# Patient Record
Sex: Male | Born: 1990 | Race: White | Hispanic: No | Marital: Single | Smoking: Former smoker
Health system: Southern US, Community
[De-identification: ages and names within clinical notes are randomized; demographics above are authoritative.]

## PROBLEM LIST (undated history)

## (undated) DIAGNOSIS — G521 Disorders of glossopharyngeal nerve: Secondary | ICD-10-CM

## (undated) HISTORY — PX: RADIOFREQUENCY ABLATION: SHX5911

---
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
## 2018-05-25 ENCOUNTER — Ambulatory Visit (INDEPENDENT_AMBULATORY_CARE_PROVIDER_SITE_OTHER)
Admission: RE | Admit: 2018-05-25 | Discharge: 2018-05-25 | Disposition: A | Discharge status: Home / Self Care | Payer: Commercial Managed Care - PPO | Source: Ambulatory Visit | Attending: Family Medicine | Admitting: Family Medicine

## 2018-05-25 ENCOUNTER — Encounter: Payer: Self-pay | Admitting: Family Medicine

## 2018-05-25 ENCOUNTER — Ambulatory Visit (INDEPENDENT_AMBULATORY_CARE_PROVIDER_SITE_OTHER): Payer: Commercial Managed Care - PPO | Admitting: Family Medicine

## 2018-05-25 VITALS — BP 122/80 | HR 63 | Ht 69.0 in | Wt 143.0 lb

## 2018-05-25 DIAGNOSIS — R293 Abnormal posture: Secondary | ICD-10-CM | POA: Diagnosis not present

## 2018-05-25 DIAGNOSIS — G2589 Other specified extrapyramidal and movement disorders: Secondary | ICD-10-CM | POA: Diagnosis not present

## 2018-05-25 DIAGNOSIS — M542 Cervicalgia: Secondary | ICD-10-CM | POA: Insufficient documentation

## 2018-05-25 MED ORDER — HYDROXYZINE HCL 10 MG PO TABS: 10.0000 mg | ORAL_TABLET | Freq: Three times a day (TID) | ORAL | 0 refills | Status: DC | PRN

## 2018-05-25 MED ORDER — TIZANIDINE HCL 4 MG PO TABS: 4.0000 mg | ORAL_TABLET | Freq: Every evening | ORAL | 2 refills | Status: DC

## 2018-05-25 NOTE — Patient Instructions (Signed)
Good to see you  Xray downstairs Ice is your friend. Ice 20 minutes 2 times daily. Usually after activity and before bed. Posture is key  On wall with heels, butt shoulder and head touching for a goal of 5 minutes daily  Exercises 3 times a week.  Muscle relaxer at night Hydroxyzine 3 times a day as needed See me again in 4 weeks

## 2018-05-25 NOTE — Assessment & Plan Note (Signed)
Poor posture, scapular dyskinesis, as well as stress likely contributing to patient's aches and pains.  Patient is going to do hydroxyzine for any type of stress, muscle relaxer at night, patient is going to get x-rays of the neck.  Discussed posture and ergonomics throughout the day.  Home exercise given and work with Event organiserathletic trainer.  Follow-up with me again in 3 to 4 weeks

## 2018-06-16 ENCOUNTER — Other Ambulatory Visit: Payer: Self-pay | Admitting: Emergency Medicine

## 2018-06-16 MED ORDER — HYDROXYZINE HCL 10 MG PO TABS: 10.0000 mg | ORAL_TABLET | Freq: Three times a day (TID) | ORAL | 0 refills | Status: DC | PRN

## 2018-06-16 NOTE — Telephone Encounter (Signed)
Pt called in requesting X-ray results. Advised pt Dr Katrinka Blazing is out of the office but will have someone call on Monday verifying the results are normal. Pt also requesting a refill of hydroxyzine

## 2018-07-11 DIAGNOSIS — H9203 Otalgia, bilateral: Secondary | ICD-10-CM | POA: Insufficient documentation

## 2018-08-09 ENCOUNTER — Telehealth: Payer: Self-pay | Admitting: Family Medicine

## 2018-08-09 NOTE — Telephone Encounter (Signed)
Does patient need appt for refill?

## 2018-08-09 NOTE — Telephone Encounter (Signed)
Copied from CRM [id number]. Topic: Quick Communication - Rx Refill/Question >> Aug 09, 2018  4:10 PM Elliot Gault wrote: Medication:  hydrOXYzine (ATARAX/VISTARIL) 10 MG tablet and tiZANidine (ZANAFLEX) 4 MG tablet   Has the patient contacted their pharmacy? No , informed patient please call pharmacy for any future request.  (Agent: If no, request that the patient contact the pharmacy for the refill.)   Preferred Pharmacy (with phone number or street name): [id number] - [place], [place] - [address] [phone] (Phone) [phone] (Fax)    Agent: Please be advised that RX refills may take up to 3 business days. We ask that you follow-up with your pharmacy.

## 2018-08-11 ENCOUNTER — Other Ambulatory Visit: Payer: Self-pay | Admitting: Family Medicine

## 2019-01-05 ENCOUNTER — Other Ambulatory Visit: Payer: Self-pay | Admitting: Family Medicine

## 2020-03-04 ENCOUNTER — Other Ambulatory Visit: Payer: Self-pay | Admitting: Neurology

## 2020-03-04 DIAGNOSIS — M5412 Radiculopathy, cervical region: Secondary | ICD-10-CM

## 2020-03-19 ENCOUNTER — Other Ambulatory Visit: Payer: Self-pay

## 2020-03-19 ENCOUNTER — Ambulatory Visit
Admission: RE | Admit: 2020-03-19 | Discharge: 2020-03-19 | Disposition: A | Discharge status: Home / Self Care | Payer: Commercial Managed Care - PPO | Source: Ambulatory Visit | Attending: Neurology | Admitting: Neurology

## 2020-03-19 DIAGNOSIS — M5412 Radiculopathy, cervical region: Secondary | ICD-10-CM | POA: Insufficient documentation

## 2020-04-11 ENCOUNTER — Other Ambulatory Visit: Payer: Self-pay | Admitting: Neurology

## 2020-04-11 DIAGNOSIS — M792 Neuralgia and neuritis, unspecified: Secondary | ICD-10-CM

## 2020-04-28 ENCOUNTER — Other Ambulatory Visit: Payer: Self-pay

## 2020-04-28 ENCOUNTER — Ambulatory Visit
Admission: RE | Admit: 2020-04-28 | Discharge: 2020-04-28 | Disposition: A | Discharge status: Home / Self Care | Payer: Commercial Managed Care - PPO | Source: Ambulatory Visit | Attending: Neurology | Admitting: Neurology

## 2020-04-28 DIAGNOSIS — M792 Neuralgia and neuritis, unspecified: Secondary | ICD-10-CM | POA: Insufficient documentation

## 2020-06-19 ENCOUNTER — Ambulatory Visit: Payer: Commercial Managed Care - PPO | Attending: Neurology | Admitting: Physical Therapy

## 2020-06-19 ENCOUNTER — Encounter: Payer: Self-pay | Admitting: Physical Therapy

## 2020-06-19 ENCOUNTER — Other Ambulatory Visit: Payer: Self-pay

## 2020-06-19 DIAGNOSIS — M542 Cervicalgia: Secondary | ICD-10-CM

## 2020-06-19 DIAGNOSIS — R293 Abnormal posture: Secondary | ICD-10-CM | POA: Diagnosis present
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
## 2020-07-07 ENCOUNTER — Ambulatory Visit: Payer: Commercial Managed Care - PPO | Admitting: Physical Therapy

## 2020-07-17 ENCOUNTER — Ambulatory Visit: Payer: Commercial Managed Care - PPO

## 2020-07-25 ENCOUNTER — Other Ambulatory Visit: Payer: Self-pay

## 2020-07-25 ENCOUNTER — Ambulatory Visit: Payer: Commercial Managed Care - PPO | Attending: Neurology

## 2020-07-25 DIAGNOSIS — R293 Abnormal posture: Secondary | ICD-10-CM | POA: Diagnosis present

## 2020-07-25 DIAGNOSIS — M542 Cervicalgia: Secondary | ICD-10-CM

## 2020-07-25 NOTE — Therapy (Signed)
[facility] [address] [place], [place], [address] Phone: [phone]   Fax:  [phone]  Physical Therapy Treatment  Patient Details  Name: Jeffery Adkins MRN: [id number] Date of Birth: 15-May-1991 Referring Provider (PT): Morene Crocker, MD   Encounter Date: 07/25/2020   PT End of Session - 07/25/20 1559    Visit Number 2    Number of Visits 7    Date for PT Re-Evaluation 09/17/20    Authorization Type UMR 2021-$50 copay; 60 VL    PT Start Time 1530    PT Stop Time 1615    PT Time Calculation (min) 45 min    Activity Tolerance Patient tolerated treatment well    Behavior During Therapy [facility] for tasks assessed/performed           No past medical history on file.  No past surgical history on file.  There were no vitals filed for this visit.   Subjective Assessment - 07/25/20 1544    Subjective Pt reports currently he still has constant neck pain and ear pain on R. pain is 7/10    Limitations Walking    Patient Stated Goals To relieve pressure on nerves in the back of the head and neck.  Cervical traction - has a home traction unit.    Pain Score 7     Pain Location Neck    Pain Orientation Right;Left    Pain Descriptors / Indicators Aching;Throbbing;Pressure    Pain Onset More than a month ago    Pain Frequency Constant              Grade III-IV lateral glides at C1-4 bil Suboccipital release STM to R Sternocleidomastroid, suboccipital region Gentle cervical traction  Supine chin tucks: 20 x 5" holds only 10% effort to reduce compensation from SCM Seated cervical chin tucks: 20x 5" holds                             PT Long Term Goals - 06/19/20 1651      PT LONG TERM GOAL #1   Title Pt will demonstrate independence with cervical HEP    Time 12    Period Weeks    Status New    Target Date 09/17/20      PT LONG TERM GOAL #2   Title Pt will demonstrate 5 point  improvement in NDI    Baseline TBD    Time 12    Period Weeks    Status New    Target Date 09/17/20      PT LONG TERM GOAL #3   Title Pt will demonstrate 10 deg improvement in pain free cervical spine ROM for lateral flexion and rotation to L and R to improve ability to perform head turns with driving and at work    Baseline see flow sheets    Time 12    Period Weeks    Status New    Target Date 09/17/20      PT LONG TERM GOAL #4   Title Pt will report ability to perform weight lifting exercises 2x/week and ability to ride bike for 10-15 minutes at a time with <1-2/10 pain in neck/ear    Time 12    Period Weeks    Status New    Target Date 09/17/20                 Plan - 07/25/20 1547  Clinical Impression Statement Pt reported provocation of R ear pain withcontralateral lateral glides of C1-2. Pt also reported relief of R ear pain with ispilateral lateral glides. Pt has significant limitations with face opening and closing on R upper c-spine with muscle tightness. R SCM is also very tight.    Personal Factors and Comorbidities Past/Current Experience;Profession;Time since onset of injury/illness/exacerbation    Examination-Activity Limitations Locomotion Level;Reach Overhead    Examination-Participation Restrictions Community Activity;Driving;Occupation    Stability/Clinical Decision Making Stable/Uncomplicated    Rehab Potential Good    PT Frequency Biweekly   1 visit every other week   PT Duration 12 weeks    PT Treatment/Interventions ADLs/Self Care Home Management;Aquatic Therapy;Cryotherapy;Electrical Stimulation;Moist Heat;Traction;Ultrasound;Therapeutic activities;Therapeutic exercise;Balance training;Neuromuscular re-education;Patient/family education;Manual techniques;Passive range of motion;Dry needling;Taping;Spinal Manipulations    PT Next Visit Plan fill out NDI.  dry needling to suboccipitals/R levator/paraspinals/rhomboids.  Manual therapy to neck.  Spinal  manipulations?  Initiate HEP    PT Home Exercise Plan Access Code: [email or web address]    Consulted and Agree with Plan of Care Patient           Patient will benefit from skilled therapeutic intervention in order to improve the following deficits and impairments:  Decreased mobility, Decreased range of motion, Hypomobility, Impaired sensation, Postural dysfunction, Pain  Visit Diagnosis: Cervicalgia  Abnormal posture     Problem List Patient Active Problem List   Diagnosis Date Noted  . Poor posture 05/25/2018  . 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 Neck pain 05/25/2018    Ileana Ladd, PT 07/25/2020, 4:28 PM  [facility] [address] [place], [place], [address] Phone: [phone]   Fax:  [phone]  Name: Oakes Mccready MRN: [id number] Date of Birth: [birth year]

## 2020-07-25 NOTE — Patient Instructions (Signed)
Access Code: [email or web address] URL: [email or web address] Date: 07/25/2020 Prepared by: Lavone Nian  Exercises Supine Chin Tuck - 5 x daily - 7 x weekly - 1 sets - 10 reps - 5 hold Seated Cervical Retraction - 5 x daily - 7 x weekly - 1 sets - 10 reps - 5 hold

## 2020-08-04 ENCOUNTER — Ambulatory Visit: Payer: Commercial Managed Care - PPO | Admitting: Physical Therapy

## 2020-08-04 ENCOUNTER — Encounter: Payer: Self-pay | Admitting: Physical Therapy

## 2020-08-04 ENCOUNTER — Other Ambulatory Visit: Payer: Self-pay

## 2020-08-04 DIAGNOSIS — M542 Cervicalgia: Secondary | ICD-10-CM | POA: Diagnosis not present

## 2020-08-04 DIAGNOSIS — R293 Abnormal posture: Secondary | ICD-10-CM

## 2020-08-04 NOTE — Patient Instructions (Addendum)
Trigger Point Dry Needling  . What is Trigger Point Dry Needling (DN)? o DN is a physical therapy technique used to treat muscle pain and dysfunction. Specifically, DN helps deactivate muscle trigger points (muscle knots).  o A thin filiform needle is used to penetrate the skin and stimulate the underlying trigger point. The goal is for a local twitch response (LTR) to occur and for the trigger point to relax. No medication of any kind is injected during the procedure.   . What Does Trigger Point Dry Needling Feel Like?  o The procedure feels different for each individual patient. Some patients report that they do not actually feel the needle enter the skin and overall the process is not painful. Very mild bleeding may occur. However, many patients feel a deep cramping in the muscle in which the needle was inserted. This is the local twitch response.   Marland Kitchen How Will I feel after the treatment? o Soreness is normal, and the onset of soreness may not occur for a few hours. Typically this soreness does not last longer than two days.  o Bruising is uncommon, however; ice can be used to decrease any possible bruising.  o In rare cases feeling tired or nauseous after the treatment is normal. In addition, your symptoms may get worse before they get better, this period will typically not last longer than 24 hours.   . What Can I do After My Treatment? o Increase your hydration by drinking more water for the next 24 hours. o You may place ice or heat on the areas treated that have become sore, however, do not use heat on inflamed or bruised areas. Heat often brings more relief post needling. o You can continue your regular activities, but vigorous activity is not recommended initially after the treatment for 24 hours. o DN is best combined with other physical therapy such as strengthening, stretching, and other therapies   Access Code: [email or web address] URL: [email or web address] Date:  08/04/2020 Prepared by: Bufford Lope  Exercises Supine Chin Tuck - 5 x daily - 7 x weekly - 1 sets - 10 reps - 5 hold Seated Cervical Retraction - 5 x daily - 7 x weekly - 1 sets - 10 reps - 5 hold Quadruped Full Range Thoracic Rotation with Reach - 1 x daily - 7 x weekly - 2 sets - 5-6 reps

## 2020-08-04 NOTE — Therapy (Signed)
[facility] [address] [place], [place], [address] Phone: [phone]   Fax:  [phone]  Physical Therapy Treatment  Patient Details  Name: Jeffery Adkins MRN: [id number] Date of Birth: Jul 22, 1991 Referring Provider (PT): Morene Crocker, MD   Encounter Date: 08/04/2020   PT End of Session - 08/04/20 1647    Visit Number 3    Number of Visits 7    Date for PT Re-Evaluation 09/17/20    Authorization Type UMR 2021-$50 copay; 60 VL    PT Start Time 1537    PT Stop Time 1628    PT Time Calculation (min) 51 min    Activity Tolerance Patient tolerated treatment well    Behavior During Therapy [facility] for tasks assessed/performed           History reviewed. No pertinent past medical history.  History reviewed. No pertinent surgical history.  There were no vitals filed for this visit.   Subjective Assessment - 08/04/20 1538    Subjective Pt reports significant improvement/benefit from manual therapy.  Would like to do more of that.  Felt the muscles tighten up after that appointment.  Did the exercises and stretches at home and they seemed to help initially and now it hurts to perform them.    Limitations Walking    Patient Stated Goals To relieve pressure on nerves in the back of the head and neck.  Cervical traction - has a home traction unit.    Currently in Pain? Yes    Pain Score 4     Pain Location Neck    Pain Orientation Right    Pain Descriptors / Indicators Aching;Tightness    Pain Type Chronic pain    Pain Onset More than a month ago                             [facility] Adult PT Treatment/Exercise - 08/04/20 1643      Therapeutic Activites    Therapeutic Activities Other Therapeutic Activities    Other Therapeutic Activities Reviewed educational handout on dry needling, reviewed precautions and contraindications with none identified.  Reviewed common side effects and ways to mitigate  side effects      Exercises   Exercises Other Exercises    Other Exercises  Quadruped "thread the needle" x 3 reps to R and L side to increase thoracic rotation ROM, scapular mobility and contralateral shoulder stability      Manual Therapy   Manual Therapy Joint mobilization    Joint Mobilization in [place] performed grade II/III PA mobs to upper and mid thoracic spine with increased hypomobility noted on R side in upper thoracic spine and then on L side in mid thoracic spine.            Trigger Point Dry Needling - 08/04/20 1545    Consent Given? Yes    Education Handout Provided Yes    Muscles Treated Head and Neck Suboccipitals;Splenius capitus;Semispinalis capitus;Cervical multifidi    Muscles Treated Upper Quadrant Rhomboids    Dry Needling Comments Performed in prone; performed on R side only today due to patient's main area of concern    Suboccipitals Response Twitch response elicited;Palpable increased muscle length    Splenius capitus Response Twitch reponse elicited;Palpable increased muscle length    Semispinalis capitus Response Twitch reponse elicited;Palpable increased muscle length    Cervical multifidi Response Twitch reponse elicited;Palpable increased muscle length  Rhomboids Response Twitch response elicited;Palpable increased muscle length                PT Education - 08/04/20 1646    Education Details see TA, TDN education, updated HEP and added exercise for thoracic mobility.  Added one more visit due to pt having to cancel one visit    Person(s) Educated Patient    Methods Explanation;Demonstration;Handout    Comprehension Verbalized understanding;Returned demonstration            Trigger Point Dry Needling  . What is Trigger Point Dry Needling (DN)? o DN is a physical therapy technique used to treat muscle pain and dysfunction. Specifically, DN helps deactivate muscle trigger points (muscle knots).  o A thin filiform needle is used to  penetrate the skin and stimulate the underlying trigger point. The goal is for a local twitch response (LTR) to occur and for the trigger point to relax. No medication of any kind is injected during the procedure.   . What Does Trigger Point Dry Needling Feel Like?  o The procedure feels different for each individual patient. Some patients report that they do not actually feel the needle enter the skin and overall the process is not painful. Very mild bleeding may occur. However, many patients feel a deep cramping in the muscle in which the needle was inserted. This is the local twitch response.   Marland Kitchen How Will I feel after the treatment? o Soreness is normal, and the onset of soreness may not occur for a few hours. Typically this soreness does not last longer than two days.  o Bruising is uncommon, however; ice can be used to decrease any possible bruising.  o In rare cases feeling tired or nauseous after the treatment is normal. In addition, your symptoms may get worse before they get better, this period will typically not last longer than 24 hours.   . What Can I do After My Treatment? o Increase your hydration by drinking more water for the next 24 hours. o You may place ice or heat on the areas treated that have become sore, however, do not use heat on inflamed or bruised areas. Heat often brings more relief post needling. o You can continue your regular activities, but vigorous activity is not recommended initially after the treatment for 24 hours. o DN is best combined with other physical therapy such as strengthening, stretching, and other therapies   Access Code: [email or web address] URL: [email or web address] Date: 08/04/2020 Prepared by: Bufford Lope  Exercises Supine Chin Tuck - 5 x daily - 7 x weekly - 1 sets - 10 reps - 5 hold Seated Cervical Retraction - 5 x daily - 7 x weekly - 1 sets - 10 reps - 5 hold Quadruped Full Range Thoracic Rotation with Reach - 1 x daily - 7 x weekly  - 2 sets - 5-6 reps      PT Long Term Goals - 06/19/20 1651      PT LONG TERM GOAL #1   Title Pt will demonstrate independence with cervical HEP    Time 12    Period Weeks    Status New    Target Date 09/17/20      PT LONG TERM GOAL #2   Title Pt will demonstrate 5 point improvement in NDI    Baseline TBD    Time 12    Period Weeks    Status New    Target Date 09/17/20      PT LONG  TERM GOAL #3   Title Pt will demonstrate 10 deg improvement in pain free cervical spine ROM for lateral flexion and rotation to L and R to improve ability to perform head turns with driving and at work    Baseline see flow sheets    Time 12    Period Weeks    Status New    Target Date 09/17/20      PT LONG TERM GOAL #4   Title Pt will report ability to perform weight lifting exercises 2x/week and ability to ride bike for 10-15 minutes at a time with <1-2/10 pain in neck/ear    Time 12    Period Weeks    Status New    Target Date 09/17/20                 Plan - 08/04/20 1648    Clinical Impression Statement Pt reported significant benefit from previous manual therapy but little carryover to today's session.  Continued to address areas of increased myofascial pain, tension and decreased ROM with trigger point dry needling, manual therapy and thoracic mobility exercise.  Pt reporting decreased tension at end of session.  Will continue to address and progress towards LTG.    Personal Factors and Comorbidities Past/Current Experience;Profession;Time since onset of injury/illness/exacerbation    Examination-Activity Limitations Locomotion Level;Reach Overhead    Examination-Participation Restrictions Community Activity;Driving;Occupation    Stability/Clinical Decision Making Stable/Uncomplicated    Rehab Potential Good    PT Frequency Biweekly   1 visit every other week   PT Duration 12 weeks    PT Treatment/Interventions ADLs/Self Care Home Management;Aquatic Therapy;Cryotherapy;Electrical  Stimulation;Moist Heat;Traction;Ultrasound;Therapeutic activities;Therapeutic exercise;Balance training;Neuromuscular re-education;Patient/family education;Manual techniques;Passive range of motion;Dry needling;Taping;Spinal Manipulations    PT Next Visit Plan How did he respond to dry needling?  Manual therapy to thoracic spine and C1/C2.  Soft tissue work.  Postural exercises.    PT Home Exercise Plan Access Code: [email or web address]    Consulted and Agree with Plan of Care Patient           Patient will benefit from skilled therapeutic intervention in order to improve the following deficits and impairments:  Decreased mobility, Decreased range of motion, Hypomobility, Impaired sensation, Postural dysfunction, Pain  Visit Diagnosis: Cervicalgia  Abnormal posture     Problem List Patient Active Problem List   Diagnosis Date Noted  . Poor posture 05/25/2018  . 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 Neck pain 05/25/2018    Dierdre Highman, PT, DPT 08/04/20    4:51 PM    [facility] [address] [place], [place], [address] Phone: [phone]   Fax:  [phone]  Name: Baldwin Racicot MRN: [id number] Date of Birth: [birth year]

## 2020-08-19 ENCOUNTER — Other Ambulatory Visit: Payer: Self-pay

## 2020-08-19 ENCOUNTER — Ambulatory Visit: Payer: Commercial Managed Care - PPO | Attending: Neurology

## 2020-08-19 DIAGNOSIS — R293 Abnormal posture: Secondary | ICD-10-CM | POA: Diagnosis present

## 2020-08-19 DIAGNOSIS — M542 Cervicalgia: Secondary | ICD-10-CM | POA: Insufficient documentation

## 2020-08-19 NOTE — Therapy (Signed)
[facility] [address] [place], [place], [address] Phone: [phone]   Fax:  [phone]  Physical Therapy Treatment  Patient Details  Name: Jeffery Adkins MRN: [id number] Date of Birth: Oct 21, 1990 Referring Provider (PT): Morene Crocker, MD   Encounter Date: 08/19/2020   PT End of Session - 08/19/20 1529    Visit Number 4    Number of Visits 7    Date for PT Re-Evaluation 09/17/20    Authorization Type UMR 2021-$50 copay; 60 VL    PT Start Time 1530    PT Stop Time 1600    PT Time Calculation (min) 30 min    Activity Tolerance Patient tolerated treatment well    Behavior During Therapy [facility] for tasks assessed/performed           History reviewed. No pertinent past medical history.  History reviewed. No pertinent surgical history.  There were no vitals filed for this visit.   Subjective Assessment - 08/19/20 1528    Subjective Pt reports after dry needling, there is less pain in middle of back and in R side of neck. I still feel tightness hasn't changed at base of head. R ear pain is about the same at 5/10. Pt is reporing more prssure in L ear which is unusual.    Limitations Walking    Patient Stated Goals To relieve pressure on nerves in the back of the head and neck.  Cervical traction - has a home traction unit.    Pain Onset More than a month ago                  Grade IV OA AP mobilization, central and unilateral R and L STM to suboccipital region Grade IV PA mobilization in upper and mid Thoracic spine, t12 region Standing thoracic extensions against wall with elbow on wall: 20x                           PT Long Term Goals - 06/19/20 1651      PT LONG TERM GOAL #1   Title Pt will demonstrate independence with cervical HEP    Time 12    Period Weeks    Status New    Target Date 09/17/20      PT LONG TERM GOAL #2   Title Pt will demonstrate 5 point  improvement in NDI    Baseline TBD    Time 12    Period Weeks    Status New    Target Date 09/17/20      PT LONG TERM GOAL #3   Title Pt will demonstrate 10 deg improvement in pain free cervical spine ROM for lateral flexion and rotation to L and R to improve ability to perform head turns with driving and at work    Baseline see flow sheets    Time 12    Period Weeks    Status New    Target Date 09/17/20      PT LONG TERM GOAL #4   Title Pt will report ability to perform weight lifting exercises 2x/week and ability to ride bike for 10-15 minutes at a time with <1-2/10 pain in neck/ear    Time 12    Period Weeks    Status New    Target Date 09/17/20                 Plan - 08/19/20 1611  Clinical Impression Statement Today, we focused on improving upper cervical and thoracic mobility Pt reported decreased ear pain and stiffness in neck and upper back after the end of the sessions.    Personal Factors and Comorbidities Past/Current Experience;Profession;Time since onset of injury/illness/exacerbation    Examination-Activity Limitations Locomotion Level;Reach Overhead    Examination-Participation Restrictions Community Activity;Driving;Occupation    Stability/Clinical Decision Making Stable/Uncomplicated    Rehab Potential Good    PT Frequency Biweekly   1 visit every other week   PT Duration 12 weeks    PT Treatment/Interventions ADLs/Self Care Home Management;Aquatic Therapy;Cryotherapy;Electrical Stimulation;Moist Heat;Traction;Ultrasound;Therapeutic activities;Therapeutic exercise;Balance training;Neuromuscular re-education;Patient/family education;Manual techniques;Passive range of motion;Dry needling;Taping;Spinal Manipulations    PT Next Visit Plan How did he respond to dry needling?  Manual therapy to thoracic spine and C1/C2.  Soft tissue work.  Postural exercises.    PT Home Exercise Plan Access Code: [email or web address], [email or web address]    Consulted and Agree with Plan of Care  Patient           Patient will benefit from skilled therapeutic intervention in order to improve the following deficits and impairments:  Decreased mobility, Decreased range of motion, Hypomobility, Impaired sensation, Postural dysfunction, Pain  Visit Diagnosis: Cervicalgia  Abnormal posture     Problem List Patient Active Problem List   Diagnosis Date Noted  . Poor posture 05/25/2018  . 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 Neck pain 05/25/2018    Ileana Ladd, PT 08/19/2020, 4:17 PM  [facility] [address] [place], [place], [address] Phone: [phone]   Fax:  [phone]  Name: Jeffery Adkins MRN: [id number] Date of Birth: 01/03/1991

## 2020-09-01 ENCOUNTER — Encounter: Payer: Commercial Managed Care - PPO | Admitting: Physical Therapy

## 2020-09-09 ENCOUNTER — Other Ambulatory Visit: Payer: Self-pay

## 2020-09-09 ENCOUNTER — Encounter: Payer: Self-pay | Admitting: Physical Therapy

## 2020-09-09 ENCOUNTER — Ambulatory Visit: Payer: Commercial Managed Care - PPO | Admitting: Physical Therapy

## 2020-09-09 DIAGNOSIS — M542 Cervicalgia: Secondary | ICD-10-CM

## 2020-09-09 DIAGNOSIS — R293 Abnormal posture: Secondary | ICD-10-CM

## 2020-09-09 IMAGING — MR MR HEAD W/O CM
11 series · 45 of 48 positions shown · non-contrast
Comparison: None.

CLINICAL DATA: Neuralgia.

EXAM:
MRI HEAD WITHOUT CONTRAST
TECHNIQUE: Multiplanar, multiecho pulse sequences of the brain and surrounding
structures were obtained without intravenous contrast.

[Series 5: ax dwi_tracew · axial · 3.0mm · 0.60mm/px · z∈[-55,+100]mm · 4 of 48 slices shown]
[im 1/48]
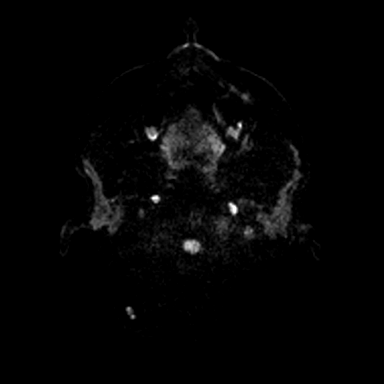
[im 16/48]
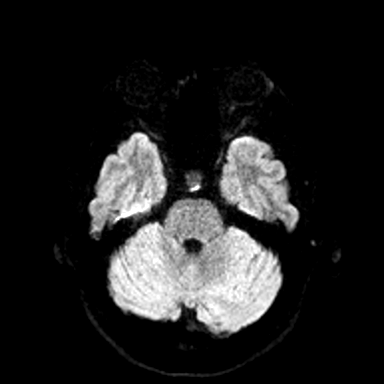
[im 32/48]
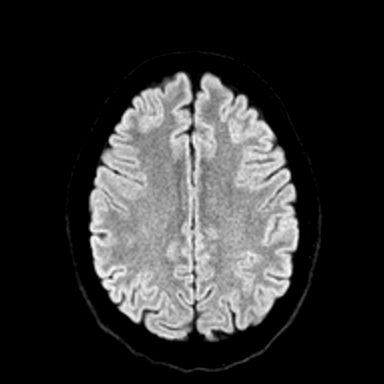
[im 48/48]
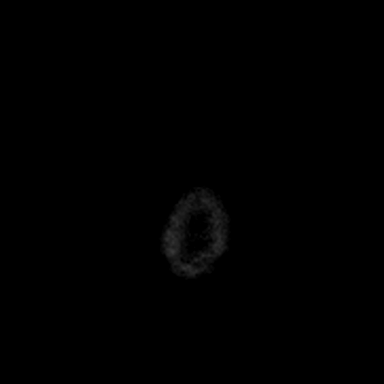

[Series 6: ax dwi_adc · axial · 3.0mm · 0.60mm/px · z∈[-55,+100]mm · 4 of 48 slices shown]
[im 1/48]
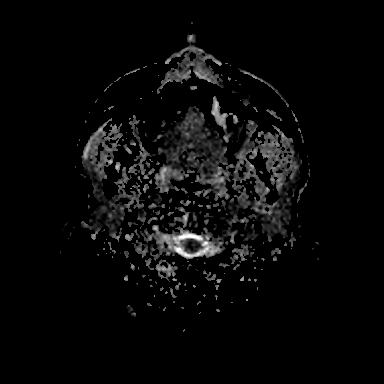
[im 16/48]
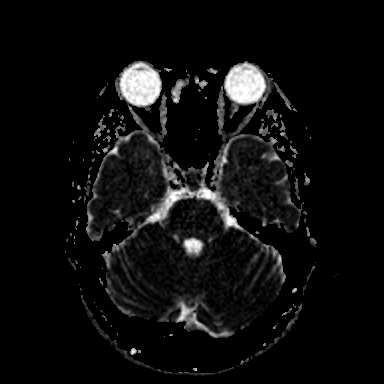
[im 32/48]
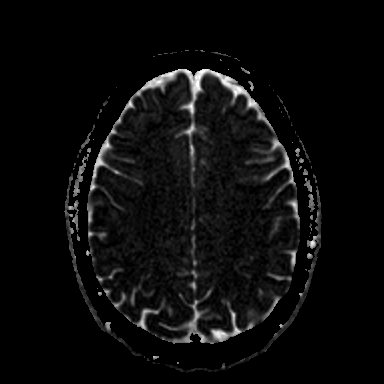
[im 48/48]
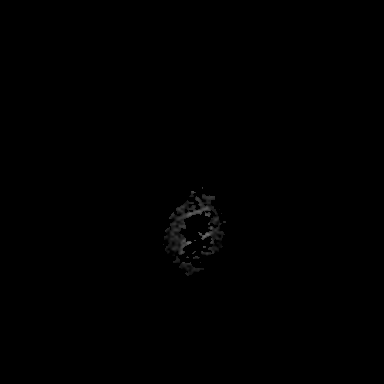

[Series 7: cor dwi_tracew · coronal · 5.0mm · 0.60mm/px · 3 of 40 slices shown]
[im 1/40]
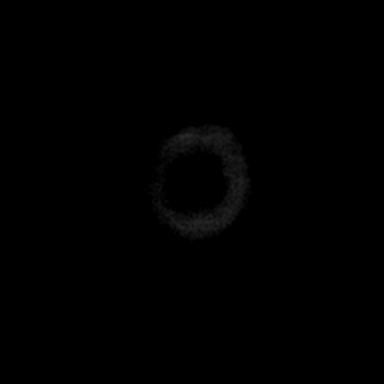
[im 20/40]
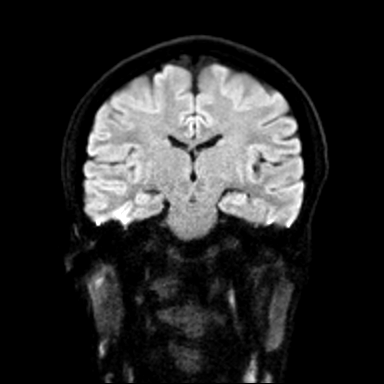
[im 40/40]
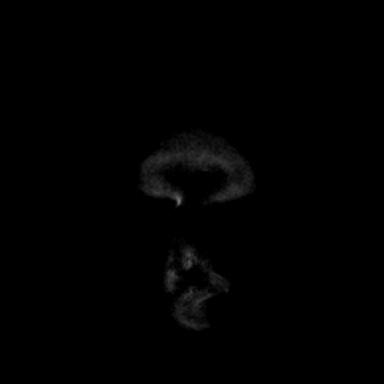

[Series 8: cor dwi_adc · coronal · 5.0mm · 0.60mm/px · 3 of 40 slices shown]
[im 1/40]
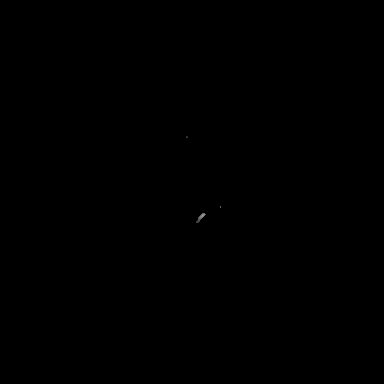
[im 20/40]
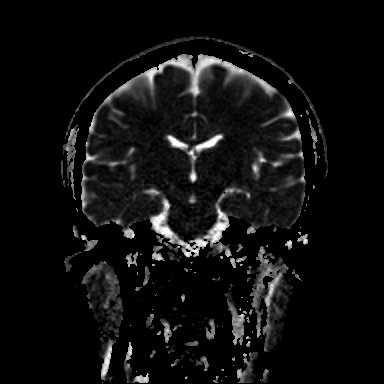
[im 40/40]
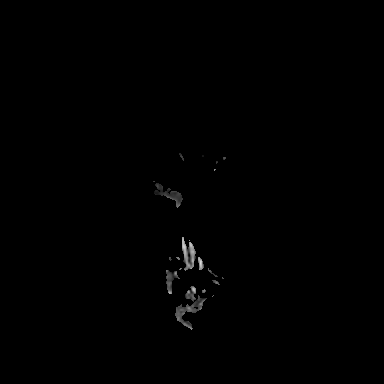

[Series 9: T1 · sagittal · 5.0mm · 0.62mm/px · 2 of 25 slices shown (1 of 2)]
[im 1/25]
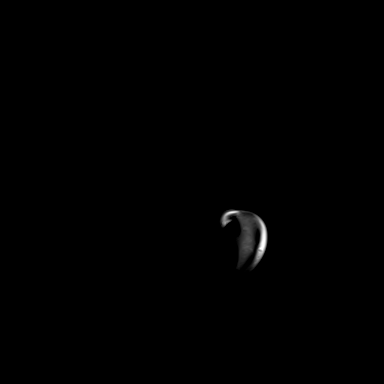
[im 25/25]
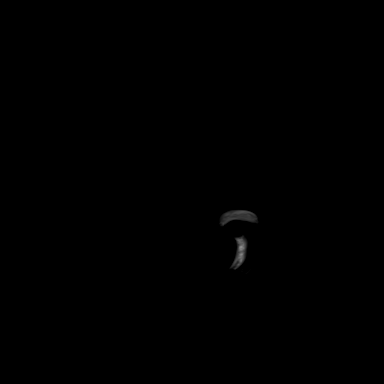

[Series 10: T2 · axial · 5.0mm · 0.53mm/px · z∈[-56,+100]mm · 2 of 27 slices shown (1 of 2)]
[im 1/27]
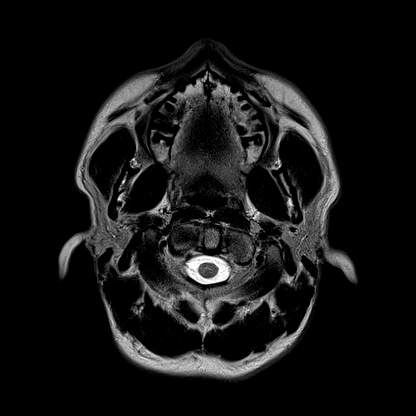
[im 27/27]
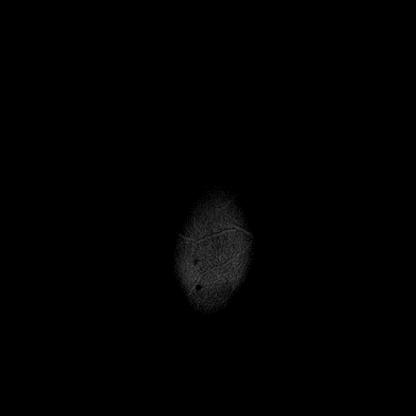

[Series 12: pha_images · axial · 3.0mm · 0.90mm/px · z∈[-66,+110]mm · 5 of 60 slices shown]
[im 1/60]
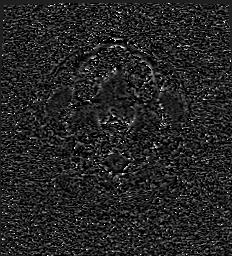
[im 15/60]
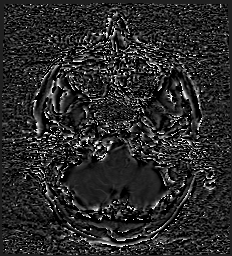
[im 30/60]
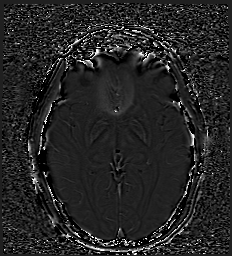
[im 45/60]
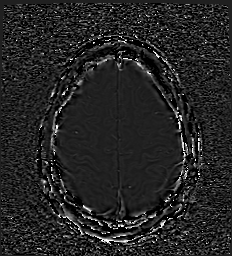
[im 60/60]
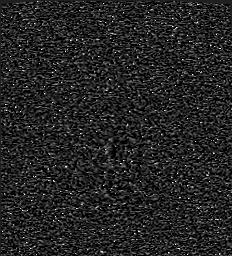

[Series 13: swi_images · axial · 3.0mm · 0.90mm/px · z∈[-66,+110]mm · 5 of 60 slices shown]
[im 1/60]
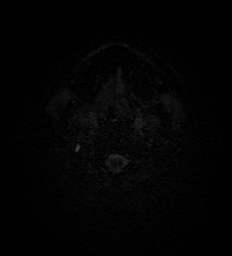
[im 15/60]
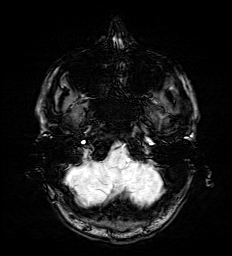
[im 30/60]
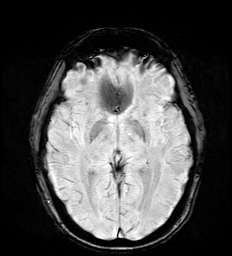
[im 45/60]
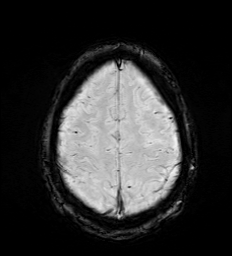
[im 60/60]
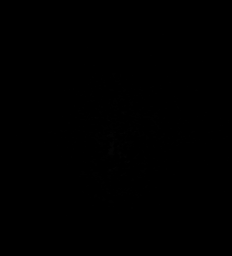

[Series 15: FLAIR · axial · 3.0mm · 0.53mm/px · z∈[-59,+103]mm · 4 of 55 slices shown]
[im 1/55]
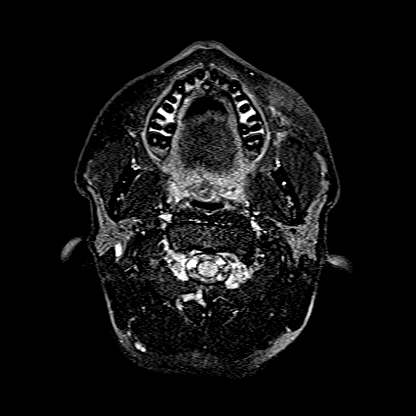
[im 19/55]
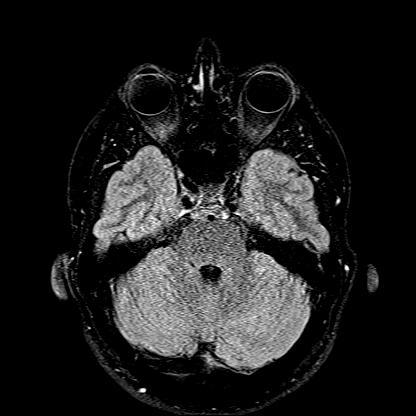
[im 37/55]
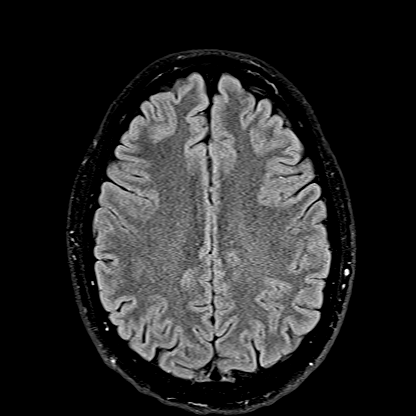
[im 55/55]
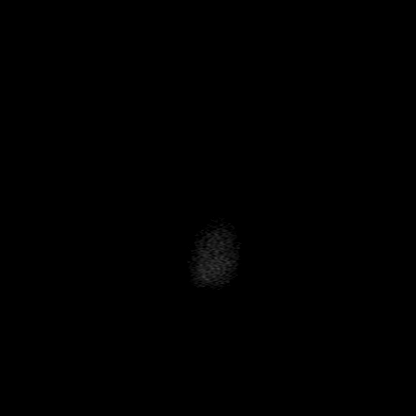

[Series 16: T1 · axial · 1.0mm · 0.98mm/px · z∈[-65,+110]mm · 11 of 176 slices shown (2 of 2)]
[im 1/176]
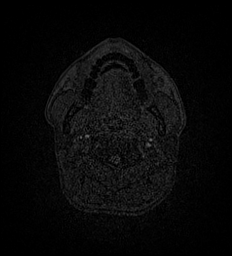
[im 14/176]
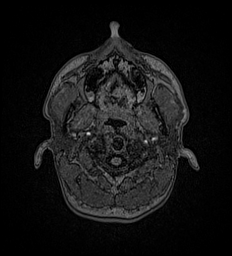
[im 27/176]
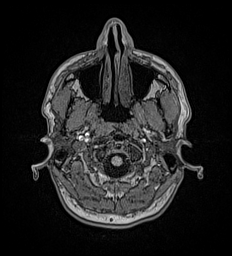
[im 41/176]
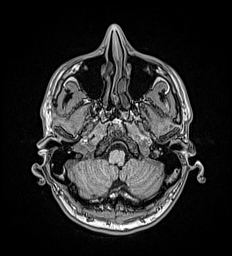
[im 54/176]
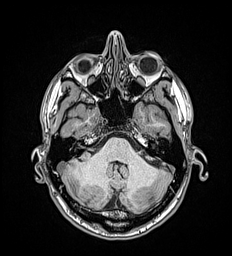
[im 68/176]
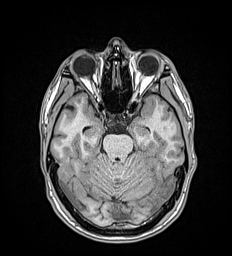
[im 81/176]
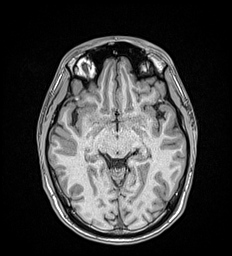
[im 95/176]
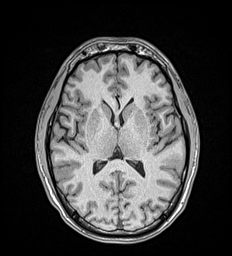
[im 122/176]
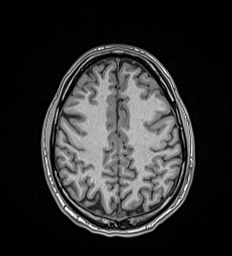
[im 149/176]
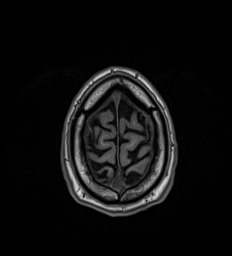
[im 176/176]
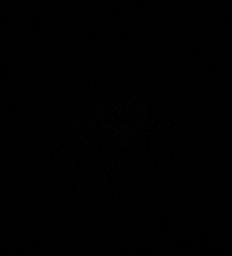

[Series 17: T2 · coronal · 5.0mm · 0.57mm/px · 2 of 31 slices shown (2 of 2)]
[im 1/31]
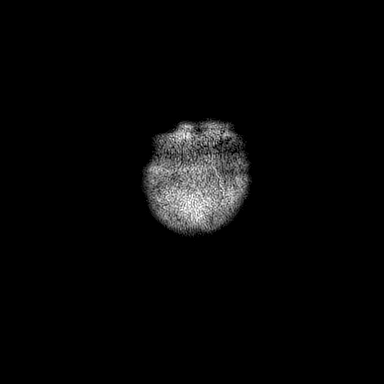
[im 31/31]
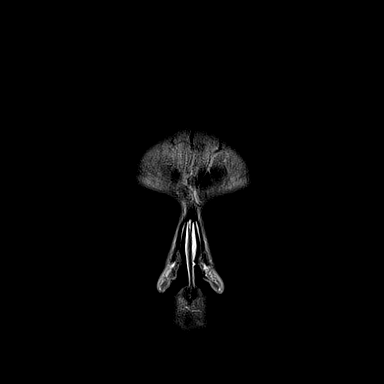

[45 of 48 positions shown; findings below may reference images not displayed]

FINDINGS: Brain: No focal parenchymal signal abnormality. No acute infarct or
intracranial hemorrhage. No midline shift, ventriculomegaly or
extra-axial fluid collection. No mass lesion.

Vascular: Normal flow voids.

Skull and upper cervical spine: Normal marrow signal.

Sinuses/Orbits: Normal orbits. Minimal ethmoid sinus mucosal
thickening. No mastoid effusion.

Other: None.
IMPRESSION: Normal MRI of the brain.

Minimal ethmoid sinus disease.

## 2020-09-09 NOTE — Patient Instructions (Signed)
Access Code: [email or web address] URL: [email or web address] Date: 07/25/2020 Prepared by: Karmesh Patel  Exercises Supine Chin Tuck - 5 x daily - 7 x weekly - 1 sets - 10 reps - 5 hold Seated Cervical Retraction - 5 x daily - 7 x weekly - 1 sets - 10 reps - 5 hold  

## 2020-09-09 NOTE — Therapy (Signed)
[facility] [address] [place], [place], [address] Phone: [phone]   Fax:  [phone]  Physical Therapy Treatment  Patient Details  Name: Onofre Gains MRN: [id number] Date of Birth: Nov 17, 1990 Referring Provider (PT): Morene Crocker, MD   Encounter Date: 09/09/2020   PT End of Session - 09/09/20 1647    Visit Number 5    Number of Visits 7    Date for PT Re-Evaluation 09/17/20    Authorization Type UMR 2021-$50 copay; 60 VL    PT Start Time 1530    PT Stop Time 1620    PT Time Calculation (min) 50 min    Activity Tolerance Patient tolerated treatment well    Behavior During Therapy [facility] for tasks assessed/performed           History reviewed. No pertinent past medical history.  History reviewed. No pertinent surgical history.  There were no vitals filed for this visit.   Subjective Assessment - 09/09/20 1534    Subjective Neck was cramping after last session but then improved.  Neck was really stiff after 12 hour drive from [place].  Ear on R side is a 3/10.  Responded to the dry needling; symptoms down the arm have improved.  Still having tension at the base of the skull.    Limitations Walking    Patient Stated Goals To relieve pressure on nerves in the back of the head and neck.  Cervical traction - has a home traction unit.    Pain Onset More than a month ago                             [facility] Adult PT Treatment/Exercise - 09/09/20 1625      Manual Therapy   Manual Therapy Joint mobilization;Myofascial release;Manual Traction    Manual therapy comments performed in supine after TDN    Joint Mobilization placed lower cervical spine in flexion and performed AP mobilizations to upper cervical spine and suboccipital space to increase ROM on right side 3 sets x 30 seconds.  Also performed lateral upglides to R C2 with slight rotation to increase mobility of upper cervical spine     Myofascial Release suboccipital release >5 minutes x 2 first with finger tips in suboccipital space and then performed with forearm supination    Manual Traction After performing suboccipital myofascial release and after mobilization.  Biased traction to R side            Trigger Point Dry Needling - 09/09/20 1659    Consent Given? Yes    Education Handout Provided Previously provided    Muscles Treated Head and Neck Suboccipitals    Dry Needling Comments performed in prone; performed on R side only    Suboccipitals Response Twitch response elicited;Palpable increased muscle length                PT Education - 09/09/20 1646    Education Details added one more session for re-assessment    Person(s) Educated Patient    Methods Explanation    Comprehension Verbalized understanding               PT Long Term Goals - 06/19/20 1651      PT LONG TERM GOAL #1   Title Pt will demonstrate independence with cervical HEP    Time 12    Period Weeks    Status New    Target Date  09/17/20      PT LONG TERM GOAL #2   Title Pt will demonstrate 5 point improvement in NDI    Baseline TBD    Time 12    Period Weeks    Status New    Target Date 09/17/20      PT LONG TERM GOAL #3   Title Pt will demonstrate 10 deg improvement in pain free cervical spine ROM for lateral flexion and rotation to L and R to improve ability to perform head turns with driving and at work    Baseline see flow sheets    Time 12    Period Weeks    Status New    Target Date 09/17/20      PT LONG TERM GOAL #4   Title Pt will report ability to perform weight lifting exercises 2x/week and ability to ride bike for 10-15 minutes at a time with <1-2/10 pain in neck/ear    Time 12    Period Weeks    Status New    Target Date 09/17/20                 Plan - 09/09/20 1648    Clinical Impression Statement Continued to utilize trigger point dry needling and manual therapy to suboccipital space due  to pt reporting significant improvement in upper back and UE pain following TDN and manual therapy.  Pt continues to report some pressure and tension on R side but pt is demonstrating improved function as he was able to race his dirtbike without any exacerbation of symptoms.    Personal Factors and Comorbidities Past/Current Experience;Profession;Time since onset of injury/illness/exacerbation    Examination-Activity Limitations Locomotion Level;Reach Overhead    Examination-Participation Restrictions Community Activity;Driving;Occupation    Stability/Clinical Decision Making Stable/Uncomplicated    Rehab Potential Good    PT Frequency Biweekly   1 visit every other week   PT Duration 12 weeks    PT Treatment/Interventions ADLs/Self Care Home Management;Aquatic Therapy;Cryotherapy;Electrical Stimulation;Moist Heat;Traction;Ultrasound;Therapeutic activities;Therapeutic exercise;Balance training;Neuromuscular re-education;Patient/family education;Manual techniques;Passive range of motion;Dry needling;Taping;Spinal Manipulations    PT Next Visit Plan Focus manual therapy on suboccipital space and C1/C2 mobility.  Diagonal head movements.    PT Home Exercise Plan Access Code: [email or web address], [email or web address]    Consulted and Agree with Plan of Care Patient           Patient will benefit from skilled therapeutic intervention in order to improve the following deficits and impairments:  Decreased mobility, Decreased range of motion, Hypomobility, Impaired sensation, Postural dysfunction, Pain  Visit Diagnosis: Cervicalgia  Abnormal posture     Problem List Patient Active Problem List   Diagnosis Date Noted  . Poor posture 05/25/2018  . 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 Neck pain 05/25/2018    Dierdre Highman, PT, DPT 09/09/20    5:00 PM    [facility] [address] [place], [place], [address] Phone: [phone]   Fax:   [phone]  Name: Shawan Corella MRN: [id number] Date of Birth: Nov 09, 1990

## 2020-09-15 ENCOUNTER — Encounter: Payer: Commercial Managed Care - PPO | Admitting: Physical Therapy

## 2020-09-15 ENCOUNTER — Other Ambulatory Visit: Payer: Self-pay

## 2020-09-15 ENCOUNTER — Ambulatory Visit: Payer: Commercial Managed Care - PPO | Attending: Neurology

## 2020-09-15 DIAGNOSIS — R293 Abnormal posture: Secondary | ICD-10-CM | POA: Insufficient documentation

## 2020-09-15 DIAGNOSIS — M542 Cervicalgia: Secondary | ICD-10-CM | POA: Diagnosis not present

## 2020-09-15 NOTE — Therapy (Signed)
[facility] [address] [place], [place], [address] Phone: [phone]   Fax:  [phone]  Physical Therapy Treatment  Patient Details  Name: Jeffery Adkins MRN: [id number] Date of Birth: 03/20/1991 Referring Provider (PT): Morene Crocker, MD   Encounter Date: 09/15/2020   PT End of Session - 09/15/20 1846    Visit Number 6    Number of Visits 7    Date for PT Re-Evaluation 09/17/20    Authorization Type UMR 2021-$50 copay; 60 VL    PT Start Time 1700    PT Stop Time 1800    PT Time Calculation (min) 60 min    Activity Tolerance Patient tolerated treatment well    Behavior During Therapy [facility] for tasks assessed/performed           History reviewed. No pertinent past medical history.  History reviewed. No pertinent surgical history.  There were no vitals filed for this visit.   Subjective Assessment - 09/15/20 1842    Subjective I did motocross over the weekend for total of 3 hours with breaks in between. Currently ear pain is 6/10    Limitations Walking    Patient Stated Goals To relieve pressure on nerves in the back of the head and neck.  Cervical traction - has a home traction unit.    Currently in Pain? Yes    Pain Score 6     Pain Location Ear    Pain Orientation Right    Pain Onset More than a month ago              Seated: - grade IV unilateral flexion opening mobilization C1-2 bil with intermittent use of muscle energy to improve mobility  Supine: - Upper cervical mobilization with intermittent use of mm energy - STM to bil sternocleidomastoid, suboccipital region, masseter, temporalis - myofascial release around jaw edge - Ear pulling in multiple directions toassess for myofascial tension and to see if there is any pain provocation, none reported by patient  Prone: Unilateral and central PA mobilization T1-2, T4, T8, T11-12 STM to bil thoracic paraspinalis Prone Central PA  mobilization with movement of cervical extension at T4: 10x  TherEx; Prone scapular retractions with hands clasps behind head: 2 x 10 Prone mid trap raises: thumbs up: 2 x 10 bil tounge in resting position: open and closing mouth: 10x                        PT Education - 09/15/20 1852    Education Details Access Code: [email or web address]: [email or web address]: 12/06/2021Prepared by: Theone Murdoch PatelExercisesProne Scapular Retraction with Hands Behind Head - 1 x daily - 7 x weekly - 2 sets - 10 repsProne Scapular Retraction Arms at Side - 1 x daily - 7 x weekly - 2 sets - 10 reps               PT Long Term Goals - 06/19/20 1651      PT LONG TERM GOAL #1   Title Pt will demonstrate independence with cervical HEP    Time 12    Period Weeks    Status New    Target Date 09/17/20      PT LONG TERM GOAL #2   Title Pt will demonstrate 5 point improvement in NDI    Baseline TBD    Time 12    Period Weeks    Status New    Target Date 09/17/20  PT LONG TERM GOAL #3   Title Pt will demonstrate 10 deg improvement in pain free cervical spine ROM for lateral flexion and rotation to L and R to improve ability to perform head turns with driving and at work    Baseline see flow sheets    Time 12    Period Weeks    Status New    Target Date 09/17/20      PT LONG TERM GOAL #4   Title Pt will report ability to perform weight lifting exercises 2x/week and ability to ride bike for 10-15 minutes at a time with <1-2/10 pain in neck/ear    Time 12    Period Weeks    Status New    Target Date 09/17/20                 Plan - 09/15/20 1844    Clinical Impression Statement Overall, patient is reporting short term beneftis and pain relief with dry needling and manual therapy. Today, patient's pain decreased from 6/10 to 1-2/10 at end of the session. There is not consistent provocative sign that recreates his ear pain.    Personal Factors and  Comorbidities Past/Current Experience;Profession;Time since onset of injury/illness/exacerbation    Examination-Activity Limitations Locomotion Level;Reach Overhead    Examination-Participation Restrictions Community Activity;Driving;Occupation    Stability/Clinical Decision Making Stable/Uncomplicated    Rehab Potential Good    PT Frequency Biweekly   1 visit every other week   PT Duration 12 weeks    PT Treatment/Interventions ADLs/Self Care Home Management;Aquatic Therapy;Cryotherapy;Electrical Stimulation;Moist Heat;Traction;Ultrasound;Therapeutic activities;Therapeutic exercise;Balance training;Neuromuscular re-education;Patient/family education;Manual techniques;Passive range of motion;Dry needling;Taping;Spinal Manipulations    PT Next Visit Plan Reassess for continuation of PT next session    PT Home Exercise Plan Access Code: [email or web address]    Consulted and Agree with Plan of Care Patient           Patient will benefit from skilled therapeutic intervention in order to improve the following deficits and impairments:  Decreased mobility, Decreased range of motion, Hypomobility, Impaired sensation, Postural dysfunction, Pain  Visit Diagnosis: Cervicalgia  Abnormal posture     Problem List Patient Active Problem List   Diagnosis Date Noted  . Poor posture 05/25/2018  . 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 Neck pain 05/25/2018    Ileana Adkins, PT 09/15/2020, 6:52 PM  [facility] [address] [place], [place], [address] Phone: [phone]   Fax:  [phone]  Name: Jeffery Adkins MRN: [id number] Date of Birth: July 05, 1991

## 2020-09-22 ENCOUNTER — Encounter: Payer: Self-pay | Admitting: Physical Therapy

## 2020-09-22 ENCOUNTER — Other Ambulatory Visit: Payer: Self-pay

## 2020-09-22 ENCOUNTER — Ambulatory Visit: Payer: Commercial Managed Care - PPO | Admitting: Physical Therapy

## 2020-09-22 DIAGNOSIS — M542 Cervicalgia: Secondary | ICD-10-CM

## 2020-09-22 DIAGNOSIS — R293 Abnormal posture: Secondary | ICD-10-CM

## 2020-09-23 NOTE — Patient Instructions (Signed)
Access Code: [email or web address] URL: [email or web address] Date: 07/25/2020 Prepared by: Lavone Nian  Exercises Supine Chin Tuck - 5 x daily - 7 x weekly - 1 sets - 10 reps - 5 hold Seated Cervical Retraction - 5 x daily - 7 x weekly - 1 sets - 10 reps - 5 hold   Access Code: [email or web address] URL: [email or web address] Date: 09/23/2020 Prepared by: Bufford Lope  Exercises Prone Scapular Retraction with Hands Behind Head - 1 x daily - 7 x weekly - 2 sets - 10 reps Prone Scapular Retraction Arms at Side - 1 x daily - 7 x weekly - 2 sets - 10 reps

## 2020-09-23 NOTE — Therapy (Signed)
[facility] [address] [place], [place], [address] Phone: [phone]   Fax:  [phone]  Physical Therapy Treatment  Patient Details  Name: Jeffery Adkins MRN: [id number] Date of Birth: 1991-08-01 Referring Provider (PT): Anabel Bene, MD   Encounter Date: 09/22/2020   PT End of Session - 09/23/20 1230    Visit Number 7    Number of Visits 11    Date for PT Re-Evaluation 12/22/20    Authorization Type UMR 2021-$50 copay; 60 VL    PT Start Time 8786    PT Stop Time 1620    PT Time Calculation (min) 46 min    Activity Tolerance Patient tolerated treatment well    Behavior During Therapy [facility] for tasks assessed/performed           History reviewed. No pertinent past medical history.  History reviewed. No pertinent surgical history.  There were no vitals filed for this visit.   Subjective Assessment - 09/22/20 1538    Subjective Pain 3-4/10 today.  Still feels like the stretching of the muscles at the base of the skull works the best.    Limitations Walking    Patient Stated Goals To relieve pressure on nerves in the back of the head and neck.  Cervical traction - has a home traction unit.    Currently in Pain? Yes    Pain Score 3     Pain Location Neck    Pain Onset More than a month ago              [facility] PT Assessment - 09/22/20 1540      Assessment   Medical Diagnosis Ear pain, neck pain, neuralgia    Referring Provider (PT) Anabel Bene, MD    Onset Date/Surgical Date 05/14/20    Prior Therapy none      Precautions   Precautions None    Precaution Comments no PMH      Prior Function   Level of Independence Independent      Observation/Other Assessments   Focus on Therapeutic Outcomes (FOTO)  Not performed    Neck Disability Index  N/A - was not captured at eval      Sensation   Light Touch Appears Intact      ROM / Strength   AROM / PROM / Strength AROM      AROM    Overall AROM  Due to pain    AROM Assessment Site Cervical    Cervical Flexion 65   sore from dry needling   Cervical Extension 65    Cervical - Right Side Bend 45    Cervical - Left Side Bend 50    Cervical - Right Rotation 65    Cervical - Left Rotation 75      Palpation   Palpation comment Still tender to palpation over R thoracic paraspinals. cervical paraspinals and R suboccipitals                         [facility] Adult PT Treatment/Exercise - 09/23/20 1226      Manual Therapy   Manual Therapy Myofascial release;Passive ROM;Soft tissue mobilization    Manual therapy comments Performed in prone and then supine after TDN    Soft tissue mobilization STM to cervical paraspinal muscles focusing on R side after TDN    Myofascial Release manual suboccipital release combined with manual traction in supine >5 minutes    Passive  ROM Blocking at C2 performed passive upper cervical flexion and side flexion to L x 3 reps for PROM and stretching of suboccipital muscles; pt reported onset of pain into R ear with stretch            Trigger Point Dry Needling - 09/22/20 1541    Consent Given? Yes    Education Handout Provided Previously provided    Muscles Treated Head and Neck Suboccipitals    Dry Needling Comments performed in prone on R side only    Suboccipitals Response Twitch response elicited;Palpable increased muscle length                PT Education - 09/23/20 1225    Education Details Plan for 4 more visits    Person(s) Educated Patient    Methods Explanation    Comprehension Verbalized understanding               PT Long Term Goals - 09/23/20 1231      PT LONG TERM GOAL #1   Title Pt will demonstrate independence with cervical HEP    Time 12    Period Weeks    Status On-going      PT LONG TERM GOAL #2   Title Pt will demonstrate 5 point improvement in NDI    Baseline TBD    Time 12    Period Weeks    Status Deferred      PT LONG TERM  GOAL #3   Title Pt will demonstrate 10 deg improvement in pain free cervical spine ROM for lateral flexion and rotation to L and R to improve ability to perform head turns with driving and at work    Baseline see flow sheets    Time 12    Period Weeks    Status Achieved      PT LONG TERM GOAL #4   Title Pt will report ability to perform weight lifting exercises 2x/week and ability to ride bike for 10-15 minutes at a time with <1-2/10 pain in neck/ear    Time 12    Period Weeks    Status Partially Met          New LTG for recertification:  PT Long Term Goals - 09/23/20 1248      PT LONG TERM GOAL #1   Title Pt will demonstrate independence with cervical HEP    Time 12    Period Weeks    Status On-going    Target Date 12/22/20      PT LONG TERM GOAL #3   Status Achieved      PT LONG TERM GOAL #4   Title Pt will report ability to perform weight lifting exercises 2x/week and ability to ride bike for >2 hours at a time with <1-2/10 pain in neck/ear    Time 12    Period Weeks    Status Revised    Target Date 12/22/20                 Plan - 09/23/20 1237    Clinical Impression Statement Patient is making steady progress and has met 1 LTG - pt now reporting full resolution of mid back pain and demonstrates 10-15 degree improvement in pain free cervical ROM and reports resolution of sensory changes in RUE.  One goal deferred as NDI was not assessed after eval.  Pt does demonstrate improvement in function and has returned to bike riding but has not returned to other forms of exercise  and weight lifting; pt has only partially met third goal.  Pt continues to present with intermittent neuralgia, trigger points and pain in R suboccipitals and paraspinals.  Pt to continue for 4 more visits to continue to address myofascial pain and restrictions as well as to ensure pt has returned to previous level of funciton and community wellness.    Personal Factors and Comorbidities Past/Current  Experience;Profession;Time since onset of injury/illness/exacerbation    Examination-Activity Limitations Locomotion Level;Reach Overhead    Examination-Participation Restrictions Community Activity;Driving;Occupation    Rehab Potential Good    PT Frequency Other (comment)   1 visit every two weeks   PT Duration 12 weeks    PT Treatment/Interventions ADLs/Self Care Home Management;Aquatic Therapy;Cryotherapy;Electrical Stimulation;Moist Heat;Traction;Ultrasound;Therapeutic activities;Therapeutic exercise;Balance training;Neuromuscular re-education;Patient/family education;Manual techniques;Passive range of motion;Dry needling;Taping;Spinal Manipulations    PT Next Visit Plan Suboccipital and paraspinal TDN, continue to progress HEP/posture work, weight lifting    Consulted and Agree with Plan of Care Patient           Patient will benefit from skilled therapeutic intervention in order to improve the following deficits and impairments:  Decreased mobility,Decreased range of motion,Hypomobility,Impaired sensation,Postural dysfunction,Pain  Visit Diagnosis: Cervicalgia  Abnormal posture     Problem List Patient Active Problem List   Diagnosis Date Noted  . Poor posture 05/25/2018  . 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 Neck pain 05/25/2018   Rico Junker, PT, DPT 09/23/20    12:47 PM    [facility] [address] [place], [place], [address] Phone: [phone]   Fax:  [phone]  Name: Daelan Gatt MRN: [id number] Date of Birth: 04-Jan-1991

## 2020-12-23 ENCOUNTER — Encounter: Payer: Self-pay | Admitting: Physical Therapy

## 2020-12-23 NOTE — Therapy (Signed)
[facility] [address] [place], [place], [address] Phone: [phone]   Fax:  [phone]  Patient Details  Name: Jeffery Adkins MRN: [id number] Date of Birth: 1991/02/09 Referring Provider:  No ref. provider found  Encounter Date: 12/23/2020  PHYSICAL THERAPY DISCHARGE SUMMARY  Visits from Start of Care: 7  Current functional level related to goals / functional outcomes: Unable to assess; at the end of 3202 pt was recertified for 4 more PT visits but pt did not contact clinic to schedule or set up those visits.  This certification period ended on 3/14.  Due to pt not returning will proceed with D/C.   Remaining deficits: Intermittent neck pain   Education / Equipment: HEP  Plan: Patient agrees to discharge.  Patient goals were partially met. Patient is being discharged due to not returning since the last visit.  ?????         Rico Junker, PT, DPT 12/23/20    5:37 PM    [facility] [address] [place], [place], [address] Phone: [phone]   Fax:  [phone]

## 2021-03-18 DIAGNOSIS — G500 Trigeminal neuralgia: Secondary | ICD-10-CM | POA: Insufficient documentation

## 2021-03-18 DIAGNOSIS — G521 Disorders of glossopharyngeal nerve: Secondary | ICD-10-CM | POA: Insufficient documentation

## 2021-07-02 ENCOUNTER — Ambulatory Visit: Payer: Commercial Managed Care - PPO | Admitting: Allergy

## 2021-08-05 ENCOUNTER — Encounter: Payer: Self-pay | Admitting: Allergy

## 2021-08-05 ENCOUNTER — Ambulatory Visit: Payer: Commercial Managed Care - PPO | Admitting: Allergy

## 2021-08-05 ENCOUNTER — Other Ambulatory Visit: Payer: Self-pay

## 2021-08-05 VITALS — BP 120/70 | HR 70 | Temp 97.6°F | Resp 16 | Ht 69.0 in | Wt 156.4 lb

## 2021-08-05 DIAGNOSIS — Z91018 Allergy to other foods: Secondary | ICD-10-CM | POA: Diagnosis not present

## 2021-08-05 DIAGNOSIS — T7840XD Allergy, unspecified, subsequent encounter: Secondary | ICD-10-CM

## 2021-08-05 DIAGNOSIS — T7840XA Allergy, unspecified, initial encounter: Secondary | ICD-10-CM | POA: Insufficient documentation

## 2021-08-05 MED ORDER — EPINEPHRINE 0.3 MG/0.3ML IJ SOAJ: 0.3000 mg | INTRAMUSCULAR | 2 refills | Status: DC | PRN

## 2021-08-05 NOTE — Progress Notes (Signed)
New Patient Note  RE: Jeffery Adkins MRN: [id number] DOB: 1991-08-10 Date of Office Visit: 08/05/2021  Consult requested by: [facility] Primary care provider: [facility]  Chief Complaint: Other (Here because of his reactions to ant bites. Reactions consist of anaphylactic shock, burning hands, swelling at the bite site, coughing, choking, almost blacking out. Symptoms begin after 5 to 10 minutes and last for about an hour.)  History of Present Illness: I had the pleasure of seeing [name] for initial evaluation at the [facility] on 08/05/2021. He is a 30 y.o. male, who is self-referred here for the evaluation of insect bite reaction.  Patient noted reactions to ants - within 5-10 minutes he notices burning hands, coughing, sneezing, throat tightness, trouble breathing and feeling like passing out. This started 3 years ago and had 4 episodes per year. Patient went to urgent care but never had formal work up. He usually takes benadryl which causes drowsiness.  The symptoms resolve within 1 hour whether he takes benadryl or not. Last episode was about 2-3 months ago.   No other reactions like this if he doesn't get stung.  Localized reactions to the hymenoptera venoms.   Assessment and Plan: [name] is a 30 y.o. male with: Allergic reaction Reaction to ants in the form hand burning, coughing, sneezing, throat tightness, trouble breathing and feeling like passing out within 5-10 minutes. He brought ant sample in but difficult to determine what type of ant it is. This started 3 years ago and has about 4 episodes per year. No prior work up. Takes benadryl which causes drowsiness. Does not have Epipen. Localized reactions to hymenoptera stings. Today's skin testing showed: Negative to fire ant.  Discussed with patient that we don't have any other ant type testing available.  Avoid ants. Recommend getting an exterminator for your ant  infestation. I have prescribed epinephrine injectable device and demonstrated proper use. For mild symptoms you can take over the counter antihistamines such as Benadryl and monitor symptoms closely. If symptoms worsen or if you have severe symptoms including breathing issues, throat closure, significant swelling, whole body hives, severe diarrhea and vomiting, lightheadedness then inject epinephrine and seek immediate medical care afterwards. Emergency action plan given. Get bloodwork as below.   History of food allergy Used to break out in hives after eating shrimp in the past but now tolerates with no issues.  Return in about 6 months (around 02/03/2022).  Meds ordered this encounter  Medications   EPINEPHrine 0.3 mg/0.3 mL IJ SOAJ injection    Sig: Inject 0.3 mg into the muscle as needed for anaphylaxis.    Dispense:  2 each    Refill:  2    May dispense generic/Mylan/Teva brand.    Lab Orders         Allergen Fire Ant         Tryptase         CBC with Differential/Platelet         Sedimentation rate         ANA w/Reflex         Thyroid Cascade Profile         C3 and C4         Allergen Hymenoptera Panel      Other allergy screening: Asthma: no Rhino conjunctivitis: yes Some sneezing, rhinorrhea, watery/red eyes in the spring - takes Claritin prn with good benefit.  Food allergy: no Used to break out  in hives in the past from shrimp - now tolerates with no issues. Skin testing in the past was negative per patient report.  Medication allergy: no Urticaria: no Eczema:no History of recurrent infections suggestive of immunodeficency: no  Diagnostics: Skin Testing:  fire ant . Negative to fire ant Results discussed with patient/family.  Food Adult Perc - 08/05/21 1400     Time Antigen Placed 0308    Allergen Manufacturer Waynette Buttery    Location Arm    Number of allergen test 3     Control-buffer 50% Glycerol Negative    Control-Histamine 1 mg/ml 2+    6. Other Negative    fire ant            Past Medical History: Patient Active Problem List   Diagnosis Date Noted   Allergic reaction 08/05/2021   History of food allergy 08/05/2021   Poor posture 05/25/2018   Scapular dyskinesis 05/25/2018   Neck pain 05/25/2018   History reviewed. No pertinent past medical history. Past Surgical History: History reviewed. No pertinent surgical history. Medication List:  Current Outpatient Medications  Medication Sig Dispense Refill   EPINEPHrine 0.3 mg/0.3 mL IJ SOAJ injection Inject 0.3 mg into the muscle as needed for anaphylaxis. 2 each 2   No current facility-administered medications for this visit.   Allergies: Not on File Social History: Social History   Socioeconomic History   Marital status: Single    Spouse name: Not on file   Number of children: Not on file   Years of education: Not on file   Highest education level: Not on file  Occupational History   Not on file  Tobacco Use   Smoking status: Former    Types: Cigarettes    Quit date: 2015    Years since quitting: 7.8   Smokeless tobacco: Former    Types: Chew    Quit date: 2015  Substance and Sexual Activity   Alcohol use: Never   Drug use: Never   Sexual activity: Not on file  Other Topics Concern   Not on file  Social History Narrative   Not on file   Social Determinants of Health   Financial Resource Strain: Not on file  Food Insecurity: Not on file  Transportation Needs: Not on file  Physical Activity: Not on file  Stress: Not on file  Social Connections: Not on file   Lives in a house. Smoking: denies Occupation: Doctor, general practice HistorySurveyor, minerals in the house: no Engineer, civil (consulting) in the family room: no Carpet in the bedroom: no Heating: gas Cooling: central Pet: yes 2 cats indoors, 1 cat outdoor  Family History: Family History  Problem Relation Age of Onset   Allergic rhinitis Mother    Review of Systems  Constitutional:  Negative for  appetite change, chills, fever and unexpected weight change.  HENT:  Negative for congestion and rhinorrhea.   Eyes:  Negative for itching.  Respiratory:  Negative for cough, chest tightness, shortness of breath and wheezing.   Cardiovascular:  Negative for chest pain.  Gastrointestinal:  Negative for abdominal pain.  Genitourinary:  Negative for difficulty urinating.  Skin:  Negative for rash.  Neurological:  Negative for headaches.   Objective: BP 120/70   Pulse 70   Temp 97.6 F (36.4 C) (Temporal)   Resp 16   Ht 5\' 9"  (1.753 m)   Wt 156 lb 6.4 oz (70.9 kg)   SpO2 97%   BMI 23.10 kg/m  Body mass index is  23.1 kg/m. Physical Exam Vitals and nursing note reviewed.  Constitutional:      Appearance: Normal appearance. He is well-developed.  HENT:     Head: Normocephalic and atraumatic.     Right Ear: Tympanic membrane and external ear normal.     Left Ear: Tympanic membrane and external ear normal.     Nose: Nose normal.     Mouth/Throat:     Mouth: Mucous membranes are moist.     Pharynx: Oropharynx is clear.  Eyes:     Conjunctiva/sclera: Conjunctivae normal.  Cardiovascular:     Rate and Rhythm: Normal rate and regular rhythm.     Heart sounds: Normal heart sounds. No murmur heard.   No friction rub. No gallop.  Pulmonary:     Effort: Pulmonary effort is normal.     Breath sounds: Normal breath sounds. No wheezing, rhonchi or rales.  Musculoskeletal:     Cervical back: Neck supple.  Skin:    General: Skin is warm.     Findings: No rash.  Neurological:     Mental Status: He is alert and oriented to person, place, and time.  Psychiatric:        Behavior: Behavior normal.  The plan was reviewed with the patient/family, and all questions/concerned were addressed.  It was my pleasure to see Jeffery Adkins today and participate in his care. Please feel free to contact me with any questions or concerns.  Sincerely,  Wyline Mood, DO Allergy & Immunology  [facility] office: [phone] [facility] office: [phone]

## 2021-08-05 NOTE — Patient Instructions (Addendum)
Today's skin testing showed: Negative to fire ant.   Avoid ants. Recommend getting an exterminator for your ant infestation. I have prescribed epinephrine injectable device and demonstrated proper use. For mild symptoms you can take over the counter antihistamines such as Benadryl and monitor symptoms closely. If symptoms worsen or if you have severe symptoms including breathing issues, throat closure, significant swelling, whole body hives, severe diarrhea and vomiting, lightheadedness then inject epinephrine and seek immediate medical care afterwards. Emergency action plan given.  Get bloodwork when able to.  We are ordering labs, so please allow 1-2 weeks for the results to come back. With the newly implemented Cures Act, the labs might be visible to you at the same time that they become visible to me. However, I will not address the results until all of the results are back, so please be patient.  In the meantime, continue recommendations in your patient instructions, including avoidance measures (if applicable), until you hear from me.  Follow up in 6 months or sooner if needed.

## 2021-08-05 NOTE — Assessment & Plan Note (Signed)
Used to break out in hives after eating shrimp in the past but now tolerates with no issues.

## 2021-08-05 NOTE — Assessment & Plan Note (Addendum)
Reaction to ants in the form hand burning, coughing, sneezing, throat tightness, trouble breathing and feeling like passing out within 5-10 minutes. He brought ant sample in but difficult to determine what type of ant it is. This started 3 years ago and has about 4 episodes per year. No prior work up. Takes benadryl which causes drowsiness. Does not have Epipen. Localized reactions to hymenoptera stings.  Today's skin testing showed: Negative to fire ant.   Discussed with patient that we don't have any other ant type testing available.  . Avoid ants. . Recommend getting an exterminator for your ant infestation. . I have prescribed epinephrine injectable device and demonstrated proper use. For mild symptoms you can take over the counter antihistamines such as Benadryl and monitor symptoms closely. If symptoms worsen or if you have severe symptoms including breathing issues, throat closure, significant swelling, whole body hives, severe diarrhea and vomiting, lightheadedness then inject epinephrine and seek immediate medical care afterwards. . Emergency action plan given. . Get bloodwork as below.

## 2021-09-06 ENCOUNTER — Ambulatory Visit (HOSPITAL_COMMUNITY)
Admission: EM | Admit: 2021-09-06 | Discharge: 2021-09-06 | Disposition: A | Discharge status: Home / Self Care | Payer: Commercial Managed Care - PPO

## 2021-09-06 DIAGNOSIS — F1114 Opioid abuse with opioid-induced mood disorder: Secondary | ICD-10-CM

## 2021-09-06 NOTE — BH Assessment (Signed)
Patient is a 30 year old male that presents this date requesting assistance with ongoing heroin use. Patient denies any S/I, H/I or AVH. Patient denies any previous attempts or gestures to self harm or a prior mental health diagnosis. Patient denies access to firearms. Patient states he has starting using heroin 4 months ago after a neck injury reporting using two to three times a week for the first two months and then went to daily use the last two weeks stating he uses up to 2 tenths of a gram daily with last use prior to arrival. 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 Patient was seen and evaluated by Melvyn Neth NP who requested the assistance of the CSW on site that provided patient with multiple treatment options to include [facility] which he could present to tomorrow. Patient was also offered a [facility] admission to assist with stabilization until the patient could get into treatment within the next 24 hours. Patient is contracting for safety stating he is currently residing with his girlfriend who can assist with support and declined that admission stating he will present to [facility]  tomorrow. This Clinical research associate spoke with patient at length in reference to area OP treatment and available programs that could further support his recovery efforts once he left a residential program.

## 2021-09-06 NOTE — Discharge Instructions (Addendum)
Follow up with the following:   [facility] - Admissions are currently completed Monday through Friday at 8am; both appointments and walk-ins are accepted.  Any individual that is a [facility] resident may present for a substance abuse screening and assessment for admission.  A person may be referred by numerous sources or self-refer.   Potential clients will be screened for medical necessity and appropriateness for the program.  Clients must meet criteria for high-intensity residential treatment services.  If clinically appropriate, a client will continue with the comprehensive clinical assessment and intake process, as well as enrollment in the [facility].  Address: [address] [place], [place] [address] Admin Hours: Mon-Fri 8AM to [facility] Hours: 24/7 Phone: [phone] Fax: [phone]  [facility]Det[facility] Facility Based Crisis:  These are 3 locations for services: Please call before arrival:    [facility] Facility Based Crisis [facility])  Address: [address] [place], [place] [address] Phone: [phone]  [facility] Facility Based Crisis [facility]) Address: [address] [place], [place] [address] Phone#: [phone]  [facility] Recovery Facility Based Crisis [facility]) Address: [address] [place], [place], [place] [address] Phone#: [phone]

## 2021-09-06 NOTE — Progress Notes (Signed)
CSW added substance abuse resources to pt's discharge instructions for follow up.    Maryjean Ka, MSW, [facility] 09/06/2021 12:27 PM

## 2021-09-06 NOTE — ED Provider Notes (Signed)
Behavioral Health Urgent Care Medical Screening Exam  Patient Name: Jeffery Adkins MRN: [id number] Date of Evaluation: 09/06/21 Chief Complaint:   Diagnosis:  Final diagnoses:  Opioid abuse with opioid-induced mood disorder ([place])   Jeffery Adkins, 30 y.o., male patient seen face to face by this provider, consulted with Dr. Lovette Cliche; and chart reviewed on 09/06/21.  On evaluation Jeffery Adkins    History of Present illness: Jeffery Adkins is a 30 y.o. male.  Presents to [facility] urgent care accompanied with his fiance requesting resources for detox.  States he has been abusing heroin and ketamine.  Reported a recent relapse 2 days. States he last used last night. Reported he had been sober for the past 3-4 months.   Patient reports he has prescription for clonazepam and Suboxone. Stated the clonazepam 0.5 mg is not helping.  He reports a history with anxiety and depression.  Denied previous suicidal attempts, thoughts or plan.   States his anxiety is heightened due to his job and substance abuse history.reported " I was hoping to stay the night because I have to be at work at 6:00 am."    He denies suicidal or homicidal ideations.  Denies auditory or visual hallucinations.  Patient was offered admission to facility based crisis however he declined as he is seeking to be restarted on Suboxone and/or benzodiazepine.  Discussed treatment options, patient reports he has a follow-up appointment with previous similar therapy services on 09/07/2021.  CSW to make additional outpatient resources available  During evaluation Bobbi Schaus is sitting  in no acute distress.He is alert/oriented x 4; calm/cooperative; and mood congruent with affect.  He is speaking in a clear tone at moderate volume, and normal pace; with good eye contact.  His thought process is coherent and relevant; There is no indication that he is currently responding to internal/external stimuli or experiencing delusional thought  content; and he has denied suicidal/self-harm/homicidal ideation, psychosis, and paranoia. Patient has remained calm throughout assessment and has answered questions appropriately.     At this time Kym Drawdy is educated and verbalizes understanding of mental health resources and other crisis services in the community.He is instructed to call 911 and present to the nearest emergency room should he experience any suicidal/homicidal ideation, auditory/visual/hallucinations, or detrimental worsening of his mental health condition. He was a also advised by Probation officer that he could call the toll-free phone on insurance card to assist with identifying in network counselors and agencies or number on back of Medicaid card to speak with care coordinator.     Psychiatric Specialty Exam  Presentation  General Appearance:Appropriate for Environment  Eye Contact:Good  Speech:Clear and Coherent  Speech Volume:Normal  Handedness:Left   Mood and Affect  Mood:Anxious; Depressed  Affect:Depressed   Thought Process  Thought Processes:Other (comment)  Descriptions of Associations:Intact  Orientation:Full (Time, Place and Person)  Thought Content:Logical    Hallucinations:None  Ideas of Reference:None  Suicidal Thoughts:No  Homicidal Thoughts:No   Sensorium  Memory:Immediate Good; Recent Good  Judgment:Poor  Insight:Fair   Executive Functions  Concentration:Fair  Attention Span:Good  Brea  Language:Good   Psychomotor Activity  Psychomotor Activity:Normal   Assets  Assets:Desire for Improvement; Intimacy; Resilience; Social Support   Sleep  Sleep:Fair  Number of hours: No data recorded  Nutritional Assessment (For OBS and FBC admissions only) Has the patient had a weight loss or gain of 10 pounds or more in the last 3 months?: No Has the patient had a decrease in food  intake/or appetite?: No Does the patient have dental problems?:  No Does the patient have eating habits or behaviors that may be indicators of an eating disorder including binging or inducing vomiting?: No Has the patient recently lost weight without trying?: 0    Physical Exam: Physical Exam Vitals reviewed.  Cardiovascular:     Rate and Rhythm: Normal rate.  Pulmonary:     Effort: Pulmonary effort is normal.  Neurological:     Mental Status: He is alert and oriented to person, place, and time.  Psychiatric:        Attention and Perception: Attention and perception normal.        Mood and Affect: Mood normal.        Speech: Speech normal.        Behavior: Behavior normal.        Thought Content: Thought content normal. Thought content does not include suicidal ideation. Thought content does not include suicidal plan.        Cognition and Memory: Cognition and memory normal.        Judgment: Judgment normal.   Review of Systems  Cardiovascular: Negative.   Gastrointestinal: Negative.   Genitourinary: Negative.   Psychiatric/Behavioral:  Positive for substance abuse. The patient is nervous/anxious.   All other systems reviewed and are negative. Blood pressure (!) 129/97, pulse 92, temperature 98.1 F (36.7 C), resp. rate 18, SpO2 97 %. There is no height or weight on file to calculate BMI.  Musculoskeletal: Strength & Muscle Tone: within normal limits Gait & Station: normal Patient leans: N/A   [facility] MSE Discharge Disposition for Follow up and Recommendations: Based on my evaluation the patient does not appear to have an emergency medical condition and can be discharged with resources and follow up care in outpatient services for Substance Abuse Intensive Outpatient Program   Oneta Rack, NP 09/06/2021, 12:33 PM

## 2021-09-06 NOTE — Discharge Summary (Signed)
Jeffery Adkins to be D/C'd Home per NP order. An After Visit Summary was printed and given to the patient by provider. Patient escorted out and D/C home via private auto.  Jeffery Adkins  09/06/2021 12:39 PM

## 2021-09-09 ENCOUNTER — Telehealth (HOSPITAL_COMMUNITY): Payer: Self-pay

## 2021-09-09 NOTE — BH Assessment (Signed)
Care Management - Discharge Follow Up    Writer made contact with the patient. Patient reports that he was declined at [facility] due to his insurance.  Patient reports that he went to [facility] and left early because he did not like the facility.   Patient reports that he is now receiving outpatient services at the [facility].

## 2021-12-06 ENCOUNTER — Encounter (HOSPITAL_COMMUNITY): Payer: Self-pay

## 2021-12-06 ENCOUNTER — Other Ambulatory Visit: Payer: Self-pay

## 2021-12-06 ENCOUNTER — Emergency Department (HOSPITAL_COMMUNITY)
Admission: EM | Admit: 2021-12-06 | Discharge: 2021-12-06 | Disposition: A | Discharge status: Home / Self Care | Payer: Commercial Managed Care - PPO | Attending: Emergency Medicine | Admitting: Emergency Medicine

## 2021-12-06 DIAGNOSIS — R519 Headache, unspecified: Secondary | ICD-10-CM | POA: Diagnosis present

## 2021-12-06 DIAGNOSIS — F1910 Other psychoactive substance abuse, uncomplicated: Secondary | ICD-10-CM

## 2021-12-06 MED ORDER — NAPROXEN 500 MG PO TABS: 500.0000 mg | ORAL_TABLET | Freq: Two times a day (BID) | ORAL | Status: DC | PRN

## 2021-12-06 MED ORDER — DICYCLOMINE HCL 20 MG PO TABS: 20.0000 mg | ORAL_TABLET | Freq: Two times a day (BID) | ORAL | 0 refills | Status: DC

## 2021-12-06 MED ORDER — METHOCARBAMOL 500 MG PO TABS: 500.0000 mg | ORAL_TABLET | Freq: Three times a day (TID) | ORAL | Status: DC | PRN

## 2021-12-06 MED ORDER — ONDANSETRON HCL 4 MG PO TABS: 4.0000 mg | ORAL_TABLET | Freq: Three times a day (TID) | ORAL | 0 refills | Status: DC | PRN

## 2021-12-06 MED ORDER — OXYCODONE-ACETAMINOPHEN 5-325 MG PO TABS
2.0000 | ORAL_TABLET | Freq: Once | ORAL | Status: AC
  Administered 2021-12-06: 2 via ORAL
  Filled 2021-12-06: qty 2

## 2021-12-06 MED ORDER — HYDROXYZINE HCL 25 MG PO TABS: 25.0000 mg | ORAL_TABLET | Freq: Four times a day (QID) | ORAL | Status: DC | PRN

## 2021-12-06 MED ORDER — LOPERAMIDE HCL 2 MG PO CAPS: 2.0000 mg | ORAL_CAPSULE | ORAL | Status: DC | PRN

## 2021-12-06 MED ORDER — SODIUM CHLORIDE 0.9 % IV SOLN
1.5000 mg/kg | Freq: Once | INTRAVENOUS | Status: AC
  Administered 2021-12-06: 98 mg via INTRAVENOUS
  Filled 2021-12-06: qty 4.9

## 2021-12-06 MED ORDER — DICYCLOMINE HCL 20 MG PO TABS: 20.0000 mg | ORAL_TABLET | Freq: Four times a day (QID) | ORAL | Status: DC | PRN

## 2021-12-06 MED ORDER — ONDANSETRON 4 MG PO TBDP: 4.0000 mg | ORAL_TABLET | Freq: Four times a day (QID) | ORAL | Status: DC | PRN

## 2021-12-06 NOTE — ED Provider Notes (Signed)
[facility] DEPT Provider Note   CSN: [id number] Arrival date & time: 12/06/21  1341     History  Chief Complaint  Patient presents with   Drug Problem    Ketamine, fentynal   Facial Pain    Jeffery Adkins is a 31 y.o. male. The patient presents to the emergency department complaining of a burning facial pain that is felt on the right side. This has been chronic for as long as five years. The patient had a glossopharyngeal nerve ablation in September of 2022. He says this did nothing for his pain. He was seen by pain management who prescribed Norco. The patient says it didn't work and he was prescribed suboxone. Recently he has been using drugs from the street to help relieve pain. This week he used fentanyl. He then used ketamine to try to stop using fentanyl. The patient is looking for pain management and pain relief.    HPI     Home Medications Prior to Admission medications   Medication Sig Start Date End Date Taking? Authorizing Provider  dicyclomine (BENTYL) 20 MG tablet Take 1 tablet (20 mg total) by mouth 2 (two) times daily. 12/06/21  Yes Cherlynn June B, PA  ondansetron (ZOFRAN) 4 MG tablet Take 1 tablet (4 mg total) by mouth every 8 (eight) hours as needed for nausea or vomiting. 12/06/21  Yes Cherlynn June B, PA  EPINEPHrine 0.3 mg/0.3 mL IJ SOAJ injection Inject 0.3 mg into the muscle as needed for anaphylaxis. 08/05/21   Garnet Sierras, DO      Allergies    Patient has no allergy information on record.    Review of Systems   Review of Systems  Physical Exam Updated Vital Signs BP (!) 134/91    Pulse (!) 107    Temp 98.3 F (36.8 C) (Oral)    Resp (!) 21    Ht 5\' 9"  (1.753 m)    Wt 65.8 kg    SpO2 97%    BMI 21.41 kg/m  Physical Exam  ED Results / Procedures / Treatments   Labs (all labs ordered are listed, but only abnormal results are displayed) Labs Reviewed - No data to display  EKG None  Radiology No results  found.  Procedures Procedures    Medications Ordered in ED Medications  dicyclomine (BENTYL) tablet 20 mg (has no administration in time range)  hydrOXYzine (ATARAX) tablet 25 mg (has no administration in time range)  loperamide (IMODIUM) capsule 2-4 mg (has no administration in time range)  methocarbamol (ROBAXIN) tablet 500 mg (has no administration in time range)  naproxen (NAPROSYN) tablet 500 mg (has no administration in time range)  ondansetron (ZOFRAN-ODT) disintegrating tablet 4 mg (has no administration in time range)  lidocaine (XYLOCAINE) 98 mg in sodium chloride 0.9 % 100 mL IVPB (0 mg Intravenous Stopped 12/06/21 1618)  oxyCODONE-acetaminophen (PERCOCET/ROXICET) 5-325 MG per tablet 2 tablet (2 tablets Oral Given 12/06/21 1623)    ED Course/ Medical Decision Making/ A&P                           Medical Decision Making  The patient presents for concerns about opioid withdrawal, ketamine use, pain control, and chronic pain control. 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   The case was discussed with Dr.Lockwood, attending physician. The plan was made to try a lidocaine infusion for short-term pain relief. Screening  EKG completed showing normal sinus rhythm. The lidocaine was administered. Medications were ordered for withdrawal symptoms including imodium for diarrhea, bentyl for abdominal pain, robaxin for muscle spasms, naproxen for pain, and zofran for nausea.   A discussion was had with the patient about expectations during his emergency room visit. The patient wants immediate pain relief and wants chronic pain management. It was explained to the patient that the emergency department could not manage the chronic portion of the pain. I explained that we could try to alleviate pain during the visit and could help with opioid withdrawal symptom relief. The patient stated that he understood. The patient has not  followed up with neurosurgery since having ablation of the glossopharyngeal nerve. I strongly recommended follow up with both neurosurgery and pain management during the discussion.   The patient was re-evaluated after the lidocaine administration. His pain had not improved. The patient requested a few days of opiates to help "get by". I do not feel comfortable prescribing narcotics to the patient due to his usage history. I did order percocet while in hospital which seemed to help some.  I do believe that the patient is stable for discharge at this time. I discussed the dangers of fentanyl use with the patient and the patient states that he understands. I provided prescriptions for withdrawal symptoms. I offered gabapentin for nerve pain but the patient declined.  I advised the patient to follow up with both neurosurgery and pain management. Return precautions provided.  Final Clinical Impression(s) / ED Diagnoses Final diagnoses:  Substance abuse ([place])    Rx / DC Orders ED Discharge Orders          Ordered    dicyclomine (BENTYL) 20 MG tablet  2 times daily        12/06/21 1621    ondansetron (ZOFRAN) 4 MG tablet  Every 8 hours PRN        12/06/21 1621              Dorothyann Peng, [place] 12/06/21 1647    Carmin Muskrat, MD 12/06/21 2004

## 2021-12-06 NOTE — Discharge Instructions (Addendum)
You were seen today for pain and for opioid withdrawal symptoms. It is important that you follow up with your pain management clinic for further chronic pain management. I recommend that you follow up with neurosurgery for the glossopharyngeal neuralgia. Please avoid using non-prescribed narcotic pain medication. There is a strong possibility of overdose which could lead to death. I have provided prescriptions for Bentyl for stomach cramps and Zofran for nausea to be used as needed. Return to the emergency department if you develop life threatening conditions such as chest pain, shortness of breath, or altered level of consciousness.

## 2021-12-06 NOTE — ED Triage Notes (Signed)
Pt bib ems from home c/o chronic migraine pain.  Pt admits to taking fentanyl and ketamine intranasal to self medicate for chronic pain. Pt states last use of fentanyl 12/05/21 am and ketamine at 1100 today.  Pt's CBG by ems 85.

## 2021-12-06 NOTE — ED Notes (Signed)
Pt states understanding of dc instructions, importance of follow up. Pt denies questions or concerns and declined transportation assistance upon dc. Pt ambulated w/ a steady gait w/o need for assistance. No belongings left in room upon dc. ? ?

## 2021-12-08 ENCOUNTER — Encounter (HOSPITAL_COMMUNITY): Payer: Self-pay | Admitting: Emergency Medicine

## 2021-12-08 ENCOUNTER — Emergency Department (HOSPITAL_COMMUNITY)
Admission: EM | Admit: 2021-12-08 | Discharge: 2021-12-08 | Disposition: A | Discharge status: Home / Self Care | Payer: Commercial Managed Care - PPO | Attending: Emergency Medicine | Admitting: Emergency Medicine

## 2021-12-08 DIAGNOSIS — G521 Disorders of glossopharyngeal nerve: Secondary | ICD-10-CM | POA: Diagnosis not present

## 2021-12-08 DIAGNOSIS — R52 Pain, unspecified: Secondary | ICD-10-CM | POA: Diagnosis present

## 2021-12-08 MED ORDER — DEXAMETHASONE SODIUM PHOSPHATE 10 MG/ML IJ SOLN
10.0000 mg | Freq: Once | INTRAMUSCULAR | Status: AC
  Administered 2021-12-08: 10 mg via INTRAMUSCULAR
  Filled 2021-12-08: qty 1

## 2021-12-08 NOTE — ED Triage Notes (Signed)
Per EMS-states patient is addicted to Fentanyl and other substances-has a bed at [facility]-patient's GF is worried he will continue to use and loose bed-call 911 to see if we could "watch hime" until his bed is ready-states he took 100 mg of ketamine prior to EMS's arrival-states he has a pinched nerve in his head

## 2021-12-08 NOTE — Discharge Instructions (Signed)
Follow-up with the neurosurgeon pain management doctor.  Report he felt a pull at 6:00 when your bed opens.

## 2021-12-08 NOTE — ED Provider Notes (Signed)
[facility]-EMERGENCY DEPT Provider Note   CSN: [id number] Arrival date & time: 12/08/21  1041     History  Chief Complaint  Patient presents with   Addiction Problem    Jeffery Adkins is a 31 y.o. male.  The history is provided by the patient, medical records and the EMS personnel.   Patient is a 31 year old male with history of glossopharyngeal neuralgia and substance use disorder presenting due to pain.  Was seen yesterday in the ED, work-up reassuring and encouraged to follow-up with neurosurgery and pain management.  Patient checked into [facility], has a bed opening at 6 PM tonight.  Denies any SI or HI, states he is here because his girlfriend called EMS due to concern he would use fentanyl prior to bed opening at [facility].  Patient denies any fentanyl today, states she used ketamine an hour prior to arrival.  Home Medications Prior to Admission medications   Medication Sig Start Date End Date Taking? Authorizing Provider  dicyclomine (BENTYL) 20 MG tablet Take 1 tablet (20 mg total) by mouth 2 (two) times daily. 12/06/21   Darrick Grinder, PA-C  EPINEPHrine 0.3 mg/0.3 mL IJ SOAJ injection Inject 0.3 mg into the muscle as needed for anaphylaxis. 08/05/21   Ellamae Sia, DO  ondansetron (ZOFRAN) 4 MG tablet Take 1 tablet (4 mg total) by mouth every 8 (eight) hours as needed for nausea or vomiting. 12/06/21   Darrick Grinder, PA-C      Allergies    Patient has no allergy information on record.    Review of Systems   Review of Systems  Physical Exam Updated Vital Signs BP (!) 133/105    Pulse (!) 104    Temp 97.9 F (36.6 C) (Oral)    Resp 16    SpO2 98%  Physical Exam Vitals and nursing note reviewed. Exam conducted with a chaperone present.  Constitutional:      General: He is not in acute distress.    Appearance: Normal appearance.  HENT:     Head: Normocephalic and atraumatic.  Eyes:     General: No scleral icterus.     Extraocular Movements: Extraocular movements intact.     Pupils: Pupils are equal, round, and reactive to light.  Skin:    Coloration: Skin is not jaundiced.  Neurological:     Mental Status: He is alert. Mental status is at baseline.     Coordination: Coordination normal.    ED Results / Procedures / Treatments   Labs (all labs ordered are listed, but only abnormal results are displayed) Labs Reviewed - No data to display  EKG None  Radiology No results found.  Procedures Procedures    Medications Ordered in ED Medications  dexamethasone (DECADRON) injection 10 mg (has no administration in time range)    ED Course/ Medical Decision Making/ A&P                           Medical Decision Making Risk Prescription drug management.   Patient with history of chronic substance use disorder and glossopharyngeal neuralgia presents due to pain.  I reviewed patient's note from neurosurgery in September, he had an ablation done at that time and has not followed up since then.  He was seen yesterday in the ED, reviewed that note as well.  Patient states pain is roughly the same.  States that oxycodone, gabapentin, anti-inflammatory, steroids all do not alleviate his  pain.  Will give Decadron for 3 days for reduction of inflammation potential.  Discussed with patient that I will not be ordering of any narcotic medicine at this time.  I encouraged him to follow-up with his pain medicine doctor as well as neurosurgeon.  He is not homicidal or suicidal, not in withdrawal.  Oriented and overall well-appearing.  I feel he is appropriate for discharge at this time, he is in agreement.        Final Clinical Impression(s) / ED Diagnoses Final diagnoses:  None    Rx / DC Orders ED Discharge Orders     None         Theron Arista, Cordelia Poche 12/08/21 1332    Gloris Manchester, MD 12/09/21 [phone]

## 2022-03-09 ENCOUNTER — Ambulatory Visit (INDEPENDENT_AMBULATORY_CARE_PROVIDER_SITE_OTHER): Payer: Commercial Managed Care - PPO | Admitting: Family Medicine

## 2022-03-09 ENCOUNTER — Encounter (HOSPITAL_BASED_OUTPATIENT_CLINIC_OR_DEPARTMENT_OTHER): Payer: Self-pay | Admitting: Family Medicine

## 2022-03-09 DIAGNOSIS — F419 Anxiety disorder, unspecified: Secondary | ICD-10-CM | POA: Diagnosis not present

## 2022-03-09 DIAGNOSIS — F32A Depression, unspecified: Secondary | ICD-10-CM

## 2022-03-09 DIAGNOSIS — G8929 Other chronic pain: Secondary | ICD-10-CM

## 2022-03-09 MED ORDER — DULOXETINE HCL 20 MG PO CPEP: 40.0000 mg | ORAL_CAPSULE | Freq: Every day | ORAL | 1 refills | Status: DC

## 2022-03-09 MED ORDER — TRAZODONE HCL 100 MG PO TABS: 50.0000 mg | ORAL_TABLET | Freq: Every day | ORAL | 1 refills | Status: DC

## 2022-03-09 NOTE — Assessment & Plan Note (Signed)
Has been managing with Cymbalta and trazodone, symptoms have been well controlled recently.  Will allow for refill of his medication at this time

## 2022-03-09 NOTE — Progress Notes (Signed)
New Patient Office Visit  Subjective    Patient ID: [place], male    DOB: 1991/06/19  Age: 31 y.o. 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  He has recently established with new specialist with plans for further interventions regarding this.  Unfortunately, due to the chronic pain patient was experiencing weakness, he did experience issues with opioid dependence, substance abuse.  He is currently clean and has been so for a few months.  Substance abuse: Issues started as above.  Patient went through a program at [facility].  [facility] started Cymbalta and Trazodone to help with chronic pain, depressive symptoms, sleep issues and these have been helping. He has been taking about 50-100 mg of Trazodone typically.  Requesting refill of these medications today.  Indicates some abnormal thyroid labs in the past and at one point was taking thyroid hormone replacement, however feels that the symptoms he was having at time were not improved by this and thus medication was discontinued.  Has not had recent thyroid labs checked, does not feel any symptoms currently and is not sure if he would want to have labs checked as a days.  Patient is originally from [place]. Has been here for about 8 years. Patient works as an Media planner. Outside of work, patient enjoys motocross, boating, working on cars, [employer].  Outpatient Encounter Medications as of 03/09/2022  Medication Sig   DULoxetine (CYMBALTA) 20 MG capsule Take 2 capsules (40 mg total) by mouth daily.   traZODone (DESYREL) 100 MG tablet Take 0.5-1 tablets (50-100 mg total) by mouth at bedtime.   dicyclomine (BENTYL) 20 MG tablet Take 1 tablet (20 mg  total) by mouth 2 (two) times daily. (Patient not taking: Reported on 03/09/2022)   EPINEPHrine 0.3 mg/0.3 mL IJ SOAJ injection Inject 0.3 mg into the muscle as needed for anaphylaxis. (Patient not taking: Reported on 03/09/2022)   ondansetron (ZOFRAN) 4 MG tablet Take 1 tablet (4 mg total) by mouth every 8 (eight) hours as needed for nausea or vomiting. (Patient not taking: Reported on 03/09/2022)   No facility-administered encounter medications on file as of 03/09/2022.    History reviewed. No pertinent past medical history.  History reviewed. No pertinent surgical history.  Family History  Problem Relation Age of Onset   Allergic rhinitis Mother     Social History   Socioeconomic History   Marital status: Single    Spouse name: Not on file   Number of children: Not on file   Years of education: Not on file   Highest education level: Not on file  Occupational History   Not on file  Tobacco Use   Smoking status: Former    Types: Cigarettes    Quit date: 2015    Years since quitting: 8.4   Smokeless tobacco: Former    Types: Chew    Quit date: 2015  Substance and Sexual Activity   Alcohol use: Never   Drug use: Yes   Sexual activity: Not on file  Other Topics Concern   Not on file  Social History Narrative   Not on file   Social Determinants of Health   Financial Resource Strain: Not on file  Food Insecurity:  Not on file  Transportation Needs: Not on file  Physical Activity: Not on file  Stress: Not on file  Social Connections: Not on file  Intimate Partner Violence: Not on file    Objective    BP (!) 133/94   Pulse 78   Temp 97.6 F (36.4 C)   Ht 5\' 9"  (1.753 m)   Wt 151 lb 11.2 oz (68.8 kg)   SpO2 100%   BMI 22.40 kg/m   Physical Exam  31 year old male in no acute distress Cardiovascular exam with regular rate and rhythm, no murmur appreciated Lungs clear to auscultation bilaterally  Assessment & Plan:   Problem List Items Addressed This Visit        Other   Chronic pain    Can continue with current medications in relation to chronic pain management as well as treatment of underlying anxiety and depression.  Refills provided today       Relevant Medications   traZODone (DESYREL) 100 MG tablet   DULoxetine (CYMBALTA) 20 MG capsule   Anxiety and depression    Has been managing with Cymbalta and trazodone, symptoms have been well controlled recently.  Will allow for refill of his medication at this time       Relevant Medications   traZODone (DESYREL) 100 MG tablet   DULoxetine (CYMBALTA) 20 MG capsule    Return in about 2 months (around 05/09/2022) for CPE.   [place] Sigal J [place], MD

## 2022-03-09 NOTE — Assessment & Plan Note (Signed)
Can continue with current medications in relation to chronic pain management as well as treatment of underlying anxiety and depression.  Refills provided today

## 2022-03-13 ENCOUNTER — Other Ambulatory Visit: Payer: Self-pay

## 2022-03-13 ENCOUNTER — Encounter (HOSPITAL_BASED_OUTPATIENT_CLINIC_OR_DEPARTMENT_OTHER): Payer: Self-pay

## 2022-03-13 ENCOUNTER — Emergency Department (HOSPITAL_BASED_OUTPATIENT_CLINIC_OR_DEPARTMENT_OTHER)
Admission: EM | Admit: 2022-03-13 | Discharge: 2022-03-13 | Disposition: A | Discharge status: Home / Self Care | Payer: Commercial Managed Care - PPO | Attending: Emergency Medicine | Admitting: Emergency Medicine

## 2022-03-13 DIAGNOSIS — S0501XA Injury of conjunctiva and corneal abrasion without foreign body, right eye, initial encounter: Secondary | ICD-10-CM | POA: Insufficient documentation

## 2022-03-13 DIAGNOSIS — Y92096 Garden or yard of other non-institutional residence as the place of occurrence of the external cause: Secondary | ICD-10-CM | POA: Diagnosis not present

## 2022-03-13 DIAGNOSIS — X58XXXA Exposure to other specified factors, initial encounter: Secondary | ICD-10-CM | POA: Insufficient documentation

## 2022-03-13 DIAGNOSIS — Y93H2 Activity, gardening and landscaping: Secondary | ICD-10-CM | POA: Diagnosis not present

## 2022-03-13 DIAGNOSIS — S058X1A Other injuries of right eye and orbit, initial encounter: Secondary | ICD-10-CM | POA: Diagnosis present

## 2022-03-13 HISTORY — DX: Disorders of glossopharyngeal nerve: G52.1

## 2022-03-13 MED ORDER — FLUORESCEIN SODIUM 1 MG OP STRP
1.0000 | ORAL_STRIP | Freq: Once | OPHTHALMIC | Status: AC
  Administered 2022-03-13: 1 via OPHTHALMIC
  Filled 2022-03-13: qty 1

## 2022-03-13 MED ORDER — TETRACAINE HCL 0.5 % OP SOLN
2.0000 [drp] | Freq: Once | OPHTHALMIC | Status: AC
  Administered 2022-03-13: 2 [drp] via OPHTHALMIC
  Filled 2022-03-13: qty 4

## 2022-03-13 MED ORDER — CIPROFLOXACIN HCL 0.3 % OP OINT
TOPICAL_OINTMENT | OPHTHALMIC | 0 refills | Status: DC
Start: 2022-03-13 — End: 2023-08-23

## 2022-03-13 NOTE — ED Triage Notes (Signed)
Pt presents with R eye irritation. States it started after doing yard work yesterday.

## 2022-03-13 NOTE — ED Provider Notes (Signed)
[facility] EMERGENCY DEPT Provider Note   CSN: [id number] Arrival date & time: 03/13/22  1452     History  Chief Complaint  Patient presents with   Eye Problem    [facility] is a 31 y.o. male.  Patient is a 31 yo male presenting for right eye pain after foreign body sensation while working outside yesterday. Admits to irritation in eye, mild blurry vision, and tearing. Denies photophobia, vision loss, bleeding, or fevers.   The history is provided by the patient. No language interpreter was used.  Eye Problem Associated symptoms: redness   Associated symptoms: no discharge, no headaches, no itching and no photophobia       Home Medications Prior to Admission medications   Medication Sig Start Date End Date Taking? Authorizing Provider  ciprofloxacin (CILOXAN) 0.3 % ophthalmic ointment Apply ribbon of ointment to lower eyelid nightly for 7 days 03/13/22  Yes Edwin Dada P, DO  dicyclomine (BENTYL) 20 MG tablet Take 1 tablet (20 mg total) by mouth 2 (two) times daily. Patient not taking: Reported on 03/09/2022 12/06/21   Darrick Grinder, PA-C  DULoxetine (CYMBALTA) 20 MG capsule Take 2 capsules (40 mg total) by mouth daily. 03/09/22   de [place], Buren Kos, MD  EPINEPHrine 0.3 mg/0.3 mL IJ SOAJ injection Inject 0.3 mg into the muscle as needed for anaphylaxis. Patient not taking: Reported on 03/09/2022 08/05/21   Ellamae Sia, DO  ondansetron (ZOFRAN) 4 MG tablet Take 1 tablet (4 mg total) by mouth every 8 (eight) hours as needed for nausea or vomiting. Patient not taking: Reported on 03/09/2022 12/06/21   Darrick Grinder, PA-C  traZODone (DESYREL) 100 MG tablet Take 0.5-1 tablets (50-100 mg total) by mouth at bedtime. 03/09/22   de [place], Raymond J, MD      Allergies    Patient has no known allergies.    Review of Systems   Review of Systems  Constitutional:  Negative for chills and fever.  Eyes:  Positive for pain, redness and visual disturbance (blurry vision).  Negative for photophobia, discharge and itching.  Neurological:  Negative for light-headedness and headaches.   Physical Exam Updated Vital Signs BP (!) 135/99 (BP Location: Right Arm)   Pulse 76   Temp 98.6 F (37 C) (Oral)   Resp 16   Ht 5\' 9"  (1.753 m)   Wt 65.8 kg   SpO2 100%   BMI 21.41 kg/m  Physical Exam Vitals and nursing note reviewed.  Constitutional:      General: He is not in acute distress.    Appearance: Normal appearance. He is not ill-appearing.  HENT:     Head: Normocephalic and atraumatic.  Eyes:     General: Lids are normal. Lids are everted, no foreign bodies appreciated. Vision grossly intact.     Extraocular Movements: Extraocular movements intact.     Conjunctiva/sclera:     Right eye: Right conjunctiva is injected.     Pupils: Pupils are equal, round, and reactive to light.     Right eye: Corneal abrasion and fluorescein uptake present. Seidel exam negative.     Slit lamp exam:    Right eye: No corneal ulcer, foreign body, hyphema, hypopyon or photophobia.  Neurological:     Mental Status: He is alert.    ED Results / Procedures / Treatments   Labs (all labs ordered are listed, but only abnormal results are displayed) Labs Reviewed - No data to display  EKG None  Radiology No  results found.  Procedures Procedures    Medications Ordered in ED Medications  tetracaine (PONTOCAINE) 0.5 % ophthalmic solution 2 drop (2 drops Right Eye Given 03/13/22 1515)  fluorescein ophthalmic strip 1 strip (1 strip Right Eye Given 03/13/22 1515)    ED Course/ Medical Decision Making/ A&P                           Medical Decision Making Risk Prescription drug management.    31 yo male presenting for right eye pain after foreign body sensation while working outside yesterday. Gross vision intact. PERRLA. Minimally injected conjunctiva. Normal EOM. Multiple small corneal abrasions seen with fluorescein staining. Eyelids everted. No foreign body on exam.  Pt does wear contact lenses. Ciprofloxacin ointment administered. Tetracaine given in ED. Pt recommended for artificial tears, ciprofloxacin at night, and close follow up with optho Monday morning.    No dilated pupil. No steamy appearance. Hx of precipitating event. Low suspicion glaucoma.   Patient in no distress and overall condition improved here in the ED. Detailed discussions were had with the patient regarding current findings, and need for close f/u with PCP or on call doctor. The patient has been instructed to return immediately if the symptoms worsen in any way for re-evaluation. Patient verbalized understanding and is in agreement with current care plan. All questions answered prior to discharge.         Final Clinical Impression(s) / ED Diagnoses Final diagnoses:  Abrasion of right cornea, initial encounter    Rx / DC Orders ED Discharge Orders          Ordered    ciprofloxacin (CILOXAN) 0.3 % ophthalmic ointment        03/13/22 1532              Franne Forts, DO 03/13/22 1536

## 2022-03-13 NOTE — Discharge Instructions (Addendum)
Multiple small corneal abrasions seen with fluorescein staining. Eyelids everted. No foreign body on exam.  Ciprofloxacin ointment prescription sent to pharmacy. Use as directed. Use artificial tears as needed.  Call provided ophthalmologist first thing Monday morning for close follow up.

## 2022-04-04 ENCOUNTER — Other Ambulatory Visit (HOSPITAL_BASED_OUTPATIENT_CLINIC_OR_DEPARTMENT_OTHER): Payer: Self-pay | Admitting: Family Medicine

## 2022-04-04 DIAGNOSIS — F32A Depression, unspecified: Secondary | ICD-10-CM

## 2022-04-05 ENCOUNTER — Other Ambulatory Visit (HOSPITAL_BASED_OUTPATIENT_CLINIC_OR_DEPARTMENT_OTHER): Payer: Self-pay

## 2022-04-24 ENCOUNTER — Other Ambulatory Visit (HOSPITAL_BASED_OUTPATIENT_CLINIC_OR_DEPARTMENT_OTHER): Payer: Self-pay | Admitting: Family Medicine

## 2022-04-24 DIAGNOSIS — F419 Anxiety disorder, unspecified: Secondary | ICD-10-CM

## 2022-05-10 ENCOUNTER — Encounter (HOSPITAL_BASED_OUTPATIENT_CLINIC_OR_DEPARTMENT_OTHER): Payer: Commercial Managed Care - PPO | Admitting: Family Medicine

## 2022-11-06 ENCOUNTER — Encounter (HOSPITAL_BASED_OUTPATIENT_CLINIC_OR_DEPARTMENT_OTHER): Payer: Self-pay | Admitting: Family Medicine

## 2022-11-08 ENCOUNTER — Other Ambulatory Visit: Payer: Self-pay | Admitting: Family Medicine

## 2022-11-08 ENCOUNTER — Other Ambulatory Visit (HOSPITAL_BASED_OUTPATIENT_CLINIC_OR_DEPARTMENT_OTHER): Payer: Self-pay | Admitting: Family Medicine

## 2022-11-08 DIAGNOSIS — F419 Anxiety disorder, unspecified: Secondary | ICD-10-CM

## 2022-11-08 MED ORDER — DULOXETINE HCL 20 MG PO CPEP: 40.0000 mg | ORAL_CAPSULE | Freq: Every day | ORAL | 0 refills | Status: DC

## 2023-01-10 ENCOUNTER — Ambulatory Visit (HOSPITAL_BASED_OUTPATIENT_CLINIC_OR_DEPARTMENT_OTHER): Payer: Commercial Managed Care - PPO | Admitting: Family Medicine

## 2023-01-13 ENCOUNTER — Encounter (HOSPITAL_BASED_OUTPATIENT_CLINIC_OR_DEPARTMENT_OTHER): Payer: Self-pay | Admitting: Family Medicine

## 2023-01-13 ENCOUNTER — Ambulatory Visit (INDEPENDENT_AMBULATORY_CARE_PROVIDER_SITE_OTHER): Payer: Commercial Managed Care - PPO | Admitting: Family Medicine

## 2023-01-13 VITALS — BP 142/90 | HR 80 | Ht 69.0 in | Wt 154.6 lb

## 2023-01-13 DIAGNOSIS — G8929 Other chronic pain: Secondary | ICD-10-CM

## 2023-01-13 DIAGNOSIS — F32A Depression, unspecified: Secondary | ICD-10-CM | POA: Diagnosis not present

## 2023-01-13 DIAGNOSIS — F419 Anxiety disorder, unspecified: Secondary | ICD-10-CM

## 2023-01-13 DIAGNOSIS — Z0000 Encounter for general adult medical examination without abnormal findings: Secondary | ICD-10-CM

## 2023-01-13 MED ORDER — DULOXETINE HCL 20 MG PO CPEP
40.0000 mg | ORAL_CAPSULE | Freq: Every day | ORAL | 0 refills | Status: DC
Start: 2023-01-13 — End: 2024-04-20

## 2023-01-13 MED ORDER — TRAZODONE HCL 100 MG PO TABS: ORAL_TABLET | ORAL | 2 refills | Status: DC

## 2023-01-13 MED ORDER — EPINEPHRINE 0.3 MG/0.3ML IJ SOAJ: 0.3000 mg | INTRAMUSCULAR | 2 refills | Status: AC | PRN

## 2023-01-13 NOTE — Progress Notes (Signed)
Established Patient Office Visit  Subjective   Patient ID: [facility], male    DOB: 31-Dec-1990  Age: 32 y.o. 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  He has recently established with new specialist with plans for further interventions regarding this.  Unfortunately, due to the chronic pain patient was experiencing weakness, he did experience issues with opioid dependence, substance abuse.  He is currently clean and has been so for a few months.   Substance abuse: Issues started as above.  Patient went through a program at [facility].  [facility] [place] [facility] started Cymbalta and Trazodone to help with chronic pain, depressive symptoms, sleep issues and these have been helping. He has been taking about 50-100 mg of Trazodone typically.  Requesting refill of these medications today.   Review of Systems  Constitutional:  Negative for malaise/fatigue.  HENT:  Negative for ear pain and tinnitus.   Eyes:  Negative for blurred vision and double vision.  Respiratory:  Negative for cough and shortness of breath.   Cardiovascular:  Negative for chest pain.  Gastrointestinal:  Negative for abdominal pain, nausea and vomiting.  Musculoskeletal:  Negative for myalgias.  Neurological:  Negative for dizziness, weakness and headaches.  Psychiatric/Behavioral:  Negative for depression, substance abuse and suicidal ideas. The patient is not nervous/anxious.       Objective:     BP (!) 142/90   Pulse 80   Ht 5\' 9"  (1.753 m)   Wt 154 lb 9.6 oz (70.1 kg)   SpO2 100%   BMI 22.83 kg/m  BP Readings from Last 3 Encounters:  01/13/23 (!) 142/90  03/13/22 135/84  03/09/22 (!) 133/94    Physical Exam Constitutional:      Appearance: Normal appearance.  Cardiovascular:     Rate and Rhythm: Normal rate and regular rhythm.     Pulses: Normal pulses.     Heart sounds: Normal heart sounds.  Pulmonary:     Effort: Pulmonary  effort is normal.     Breath sounds: Normal breath sounds.  Neurological:     Mental Status: He is alert.     Assessment & Plan:  1. Anxiety and depression Patient has a history of glossopharyngeal neuralgia, with prior GKRS procedure, and suffers from chronic pain, depressive symptoms, and sleep issues. Unfortunately, due to chronic pain, patient experienced issues with opioid dependence and substance abuse. Patient went through a program at [facility] and was started on Cymbalta and trazodone. He is currently clean and has been for a few months. Cymbalta 40mg  daily and trazodone 50-100mg  nightly have been helping with his symptoms. Requesting refill of these medications today.  - DULoxetine (CYMBALTA) 20 MG capsule; Take 2 capsules (40 mg total) by mouth daily.  Dispense: 180 capsule; Refill: 0 - traZODone (DESYREL) 100 MG tablet; TAKE 0.5-1 TABLET (50-100 MG TOTAL) BY MOUTH AT BEDTIME.  Dispense: 90 tablet; Refill: 2  2. Other chronic pain See above.  - DULoxetine (CYMBALTA) 20 MG capsule; Take 2 capsules (40 mg total) by mouth daily.  Dispense: 180 capsule; Refill: 0 - traZODone (DESYREL) 100 MG tablet; TAKE 0.5-1 TABLET (50-100 MG TOTAL) BY MOUTH AT BEDTIME.  Dispense: 90 tablet; Refill: 2  3. Wellness examination Ordered fasting labs for upcoming physical.  - CBC with Differential/Platelet; Future - Comprehensive metabolic panel; Future - Hemoglobin A1c; Future - Lipid panel; Future - TSH Rfx on Abnormal to Free T4; Future   Return in about 3  months (around 04/14/2023) for Physical with fasting labs.    Les Pou, FNP

## 2023-04-22 ENCOUNTER — Encounter (HOSPITAL_BASED_OUTPATIENT_CLINIC_OR_DEPARTMENT_OTHER): Payer: Commercial Managed Care - PPO | Admitting: Family Medicine

## 2023-08-03 ENCOUNTER — Encounter (HOSPITAL_BASED_OUTPATIENT_CLINIC_OR_DEPARTMENT_OTHER): Payer: Self-pay | Admitting: Family Medicine

## 2023-08-09 ENCOUNTER — Encounter (HOSPITAL_BASED_OUTPATIENT_CLINIC_OR_DEPARTMENT_OTHER): Payer: Self-pay | Admitting: *Deleted

## 2023-08-11 ENCOUNTER — Ambulatory Visit (HOSPITAL_BASED_OUTPATIENT_CLINIC_OR_DEPARTMENT_OTHER): Payer: Commercial Managed Care - PPO | Admitting: Family Medicine

## 2023-08-23 ENCOUNTER — Ambulatory Visit (INDEPENDENT_AMBULATORY_CARE_PROVIDER_SITE_OTHER): Payer: Commercial Managed Care - PPO | Admitting: Family Medicine

## 2023-08-23 ENCOUNTER — Encounter (HOSPITAL_BASED_OUTPATIENT_CLINIC_OR_DEPARTMENT_OTHER): Payer: Self-pay | Admitting: Family Medicine

## 2023-08-23 VITALS — BP 142/94 | HR 68 | Ht 69.5 in | Wt 154.9 lb

## 2023-08-23 DIAGNOSIS — F419 Anxiety disorder, unspecified: Secondary | ICD-10-CM | POA: Diagnosis not present

## 2023-08-23 DIAGNOSIS — F32A Depression, unspecified: Secondary | ICD-10-CM

## 2023-08-23 DIAGNOSIS — Z0000 Encounter for general adult medical examination without abnormal findings: Secondary | ICD-10-CM

## 2023-08-23 NOTE — Assessment & Plan Note (Signed)
Patient reports that he generally has been doing well with medications, although did have some sexual dysfunction side effects with Cymbalta at 40 mg dose.  Given that he has been utilizing 20 mg capsules, he did reduce dose on his own to 20 mg and continues with trazodone as prescribed.  He does feel that his symptoms remain adequately controlled with dose of Cymbalta at 20 mg and feels that side effects have been lessened at this dose as well.  Does not need refill at this time. Did have issue where he overslept due to taking trazodone later at night.  This is led to some issues at work given arriving late and changes with attendance policy at work.  He is requesting to have note for work if this does occur which is something that we can look to provide if needed.  He also reports being told by staff at work about possible FMLA related to this. Would recommend ensuring the trazodone is taken earlier in the evening to lower risk of issues with waking 1 time in the morning.  If continue to have difficulty with this, we can explore as an option for patient

## 2023-08-23 NOTE — Progress Notes (Signed)
    Procedures performed today:    None.  Independent interpretation of notes and tests performed by another provider:   None.  Brief History, Exam, Impression, and Recommendations:    BP (!) 148/94 (BP Location: Right Arm, Patient Position: Sitting, Cuff Size: Normal)   Pulse 68   Ht 5' 9.5" (1.765 m)   Wt 154 lb 14.4 oz (70.3 kg)   SpO2 100%   BMI 22.55 kg/m   Anxiety and depression Assessment & Plan: Patient reports that he generally has been doing well with medications, although did have some sexual dysfunction side effects with Cymbalta at 40 mg dose.  Given that he has been utilizing 20 mg capsules, he did reduce dose on his own to 20 mg and continues with trazodone as prescribed.  He does feel that his symptoms remain adequately controlled with dose of Cymbalta at 20 mg and feels that side effects have been lessened at this dose as well.  Does not need refill at this time. Did have issue where he overslept due to taking trazodone later at night.  This is led to some issues at work given arriving late and changes with attendance policy at work.  He is requesting to have note for work if this does occur which is something that we can look to provide if needed.  He also reports being told by staff at work about possible FMLA related to this. Would recommend ensuring the trazodone is taken earlier in the evening to lower risk of issues with waking 1 time in the morning.  If continue to have difficulty with this, we can explore as an option for patient   Wellness examination -     CBC with Differential/Platelet; Future -     Comprehensive metabolic panel; Future -     Lipid panel; Future -     TSH Rfx on Abnormal to Free T4; Future  Return in about 2 months (around 10/23/2023) for CPE with fasting labs 1 week prior.   ___________________________________________ Devri Kreher de [place], MD, ABFM, CAQSM Primary Care and Sports Medicine [facility]

## 2023-09-01 ENCOUNTER — Encounter (HOSPITAL_BASED_OUTPATIENT_CLINIC_OR_DEPARTMENT_OTHER): Payer: Self-pay | Admitting: Family Medicine

## 2023-10-21 ENCOUNTER — Other Ambulatory Visit (HOSPITAL_BASED_OUTPATIENT_CLINIC_OR_DEPARTMENT_OTHER): Payer: Commercial Managed Care - PPO

## 2023-10-21 DIAGNOSIS — Z0000 Encounter for general adult medical examination without abnormal findings: Secondary | ICD-10-CM

## 2023-10-22 LAB — CBC WITH DIFFERENTIAL/PLATELET
Basophils Absolute: 0 10*3/uL (ref 0.0–0.2)
Basos: 1 %
EOS (ABSOLUTE): 0.2 10*3/uL (ref 0.0–0.4)
Eos: 4 %
Hematocrit: 48 % (ref 37.5–51.0)
Hemoglobin: 15.9 g/dL (ref 13.0–17.7)
Immature Grans (Abs): 0 10*3/uL (ref 0.0–0.1)
Immature Granulocytes: 0 %
Lymphocytes Absolute: 1.3 10*3/uL (ref 0.7–3.1)
Lymphs: 35 %
MCH: 31.7 pg (ref 26.6–33.0)
MCHC: 33.1 g/dL (ref 31.5–35.7)
MCV: 96 fL (ref 79–97)
Monocytes Absolute: 0.4 10*3/uL (ref 0.1–0.9)
Monocytes: 11 %
Neutrophils Absolute: 1.8 10*3/uL (ref 1.4–7.0)
Neutrophils: 49 %
Platelets: 302 10*3/uL (ref 150–450)
RBC: 5.01 x10E6/uL (ref 4.14–5.80)
RDW: 12.8 % (ref 11.6–15.4)
WBC: 3.7 10*3/uL (ref 3.4–10.8)

## 2023-10-22 LAB — COMPREHENSIVE METABOLIC PANEL
ALT: 24 [IU]/L (ref 0–44)
AST: 22 [IU]/L (ref 0–40)
Albumin: 4.9 g/dL (ref 4.1–5.1)
Alkaline Phosphatase: 68 [IU]/L (ref 44–121)
BUN/Creatinine Ratio: 13 (ref 9–20)
BUN: 15 mg/dL (ref 6–20)
Bilirubin Total: 0.4 mg/dL (ref 0.0–1.2)
CO2: 23 mmol/L (ref 20–29)
Calcium: 10 mg/dL (ref 8.7–10.2)
Chloride: 104 mmol/L (ref 96–106)
Creatinine, Ser: 1.12 mg/dL (ref 0.76–1.27)
Globulin, Total: 2.2 g/dL (ref 1.5–4.5)
Glucose: 92 mg/dL (ref 70–99)
Potassium: 4.6 mmol/L (ref 3.5–5.2)
Sodium: 144 mmol/L (ref 134–144)
Total Protein: 7.1 g/dL (ref 6.0–8.5)
eGFR: 89 mL/min/{1.73_m2} (ref >59)

## 2023-10-22 LAB — LIPID PANEL
Chol/HDL Ratio: 3.3 {ratio} (ref 0.0–5.0)
Cholesterol, Total: 225 mg/dL — ABNORMAL HIGH (ref 100–199)
HDL: 69 mg/dL (ref >39)
LDL Chol Calc (NIH): 148 mg/dL — ABNORMAL HIGH (ref 0–99)
Triglycerides: 49 mg/dL (ref 0–149)
VLDL Cholesterol Cal: 8 mg/dL (ref 5–40)

## 2023-10-22 LAB — TSH RFX ON ABNORMAL TO FREE T4: TSH: 3.76 u[IU]/mL (ref 0.450–4.500)

## 2023-10-24 ENCOUNTER — Ambulatory Visit (HOSPITAL_BASED_OUTPATIENT_CLINIC_OR_DEPARTMENT_OTHER): Payer: Commercial Managed Care - PPO | Admitting: Family Medicine

## 2023-11-21 ENCOUNTER — Ambulatory Visit (INDEPENDENT_AMBULATORY_CARE_PROVIDER_SITE_OTHER): Payer: Commercial Managed Care - PPO | Admitting: Family Medicine

## 2023-11-21 ENCOUNTER — Encounter (HOSPITAL_BASED_OUTPATIENT_CLINIC_OR_DEPARTMENT_OTHER): Payer: Self-pay | Admitting: Family Medicine

## 2023-11-21 VITALS — BP 144/97 | HR 78 | Ht 69.0 in | Wt 163.2 lb

## 2023-11-21 DIAGNOSIS — Z0000 Encounter for general adult medical examination without abnormal findings: Secondary | ICD-10-CM | POA: Insufficient documentation

## 2023-11-21 DIAGNOSIS — L989 Disorder of the skin and subcutaneous tissue, unspecified: Secondary | ICD-10-CM

## 2023-11-21 NOTE — Assessment & Plan Note (Signed)
 Routine HCM labs reviewed. HCM reviewed/discussed. Anticipatory guidance regarding healthy weight, lifestyle and choices given. Recommend healthy diet.  Recommend approximately 150 minutes/week of moderate intensity exercise Recommend regular dental and vision exams Always use seatbelt/lap and shoulder restraints Recommend using smoke alarms and checking batteries at least twice a year Recommend using sunscreen when outside Discussed tetanus immunization recommendations, patient thinks he is UTD

## 2023-11-21 NOTE — Patient Instructions (Signed)
  Medication Instructions:  Your physician recommends that you continue on your current medications as directed. Please refer to the Current Medication list given to you today. --If you need a refill on any your medications before your next appointment, please call your pharmacy first. If no refills are authorized on file call the office.--    Follow-Up: Your next appointment:   Your physician recommends that you schedule a follow-up appointment in: 6 mth follow up  with Dr. de Peru  You will receive a text message or e-mail with a link to a survey about your care and experience with us  today! We would greatly appreciate your feedback!   Thanks for letting us  be apart of your health journey!!  Primary Care and Sports Medicine   Dr. Court Distance Peru   We encourage you to activate your patient portal called "MyChart".  Sign up information is provided on this After Visit Summary.  MyChart is used to connect with patients for Virtual Visits (Telemedicine).  Patients are able to view lab/test results, encounter notes, upcoming appointments, etc.  Non-urgent messages can be sent to your provider as well. To learn more about what you can do with MyChart, please visit --  [email or web address].

## 2023-11-21 NOTE — Progress Notes (Signed)
 Subjective:    CC: Annual Physical Exam  HPI:  [facility] is a 33 y.o. presenting for annual physical  I reviewed the past medical history, family history, social history, surgical history, and allergies today and no changes were needed.  Please see the problem list section below in epic for further details.  Past Medical History: Past Medical History:  Diagnosis Date   Glossopharyngeal neuralgia    Past Surgical History: Past Surgical History:  Procedure Laterality Date   RADIOFREQUENCY ABLATION     Social History: Social History   Socioeconomic History   Marital status: Single    Spouse name: Not on file   Number of children: Not on file   Years of education: Not on file   Highest education level: Not on file  Occupational History   Not on file  Tobacco Use   Smoking status: Former    Current packs/day: 0.00    Types: Cigarettes    Quit date: 2015    Years since quitting: 10.1    Passive exposure: Past   Smokeless tobacco: Former    Types: Chew    Quit date: 2015  Vaping Use   Vaping status: Former  Substance and Sexual Activity   Alcohol use: Yes    Alcohol/week: 2.0 standard drinks of alcohol    Types: 2 Cans of beer per week    Comment: 10- 12 beers a week   Drug use: Not Currently   Sexual activity: Not on file  Other Topics Concern   Not on file  Social History Narrative   Not on file   Social Drivers of Health   Financial Resource Strain: Not on file  Food Insecurity: Not on file  Transportation Needs: Not on file  Physical Activity: Not on file  Stress: Not on file  Social Connections: Not on file   Family History: Family History  Problem Relation Age of Onset   Allergic rhinitis Mother    Allergies: Allergies  Allergen Reactions   Other Anaphylaxis, Anxiety, Cough, Hives, Itching, Nausea Only, Palpitations, Rash, Shortness Of Breath and Swelling   Shrimp (Diagnostic) Hives, Itching and Swelling   Medications: See med  rec.  Review of Systems: No headache, visual changes, nausea, vomiting, diarrhea, constipation, dizziness, abdominal pain, skin rash, fevers, chills, night sweats, swollen lymph nodes, weight loss, chest pain, body aches, joint swelling, muscle aches, shortness of breath, mood changes, visual or auditory hallucinations.  Objective:    BP (!) 144/95 (BP Location: Left Arm, Patient Position: Sitting, Cuff Size: Normal)   Pulse 78   Ht 5\' 9"  (1.753 m)   Wt 163 lb 3.2 oz (74 kg)   SpO2 99%   BMI 24.10 kg/m   General: Well Developed, well nourished, and in no acute distress. Neuro: Alert and oriented x3, extra-ocular muscles intact, sensation grossly intact. Cranial nerves II through XII are intact, motor, sensory, and coordinative functions are all intact. HEENT: Normocephalic, atraumatic, pupils equal round reactive to light, neck supple, no masses, no lymphadenopathy, thyroid nonpalpable. Oropharynx, nasopharynx, external ear canals are unremarkable. Skin: Warm and dry, no rashes noted.  Area near the region of scalp with small nodule noted.  Mild surrounding erythema. Cardiac: Regular rate and rhythm, no murmurs rubs or gallops. Respiratory: Clear to auscultation bilaterally. Not using accessory muscles, speaking in full sentences. Abdominal: Soft, nontender, nondistended, positive bowel sounds, no masses, no organomegaly. Musculoskeletal: Shoulder, elbow, wrist, hip, knee, ankle stable, and with full range of motion.  Impression and Recommendations:  Skin lesion -     Ambulatory referral to Dermatology  Wellness examination Assessment & Plan: Routine HCM labs reviewed. HCM reviewed/discussed. Anticipatory guidance regarding healthy weight, lifestyle and choices given. Recommend healthy diet.  Recommend approximately 150 minutes/week of moderate intensity exercise Recommend regular dental and vision exams Always use seatbelt/lap and shoulder restraints Recommend using smoke alarms  and checking batteries at least twice a year Recommend using sunscreen when outside Discussed tetanus immunization recommendations, patient thinks he is UTD   Small lesion on scalp, has been present for about a year or so, can proceed with referral to dermatology as above  Has had intermittent neck pain around bilateral shoulder blades.  Based on description, seems consistent with trigger points.  Discussed treatment considerations.  For now, patient will continue with conservative measures.  Discussed possible referral to physical therapy, will hold off for now.  Return in about 6 months (around 05/20/2024) for med check.   ___________________________________________ Durant Scibilia de [place], MD, ABFM, [facility] Primary Care and Sports Medicine [facility]

## 2024-04-02 ENCOUNTER — Ambulatory Visit: Admitting: Dermatology

## 2024-04-20 ENCOUNTER — Other Ambulatory Visit (HOSPITAL_BASED_OUTPATIENT_CLINIC_OR_DEPARTMENT_OTHER): Payer: Self-pay | Admitting: *Deleted

## 2024-04-20 ENCOUNTER — Encounter (HOSPITAL_BASED_OUTPATIENT_CLINIC_OR_DEPARTMENT_OTHER): Payer: Self-pay | Admitting: Family Medicine

## 2024-04-20 DIAGNOSIS — F419 Anxiety disorder, unspecified: Secondary | ICD-10-CM

## 2024-04-20 MED ORDER — DULOXETINE HCL 20 MG PO CPEP: 40.0000 mg | ORAL_CAPSULE | Freq: Every day | ORAL | 0 refills | Status: DC

## 2024-05-21 ENCOUNTER — Encounter (HOSPITAL_BASED_OUTPATIENT_CLINIC_OR_DEPARTMENT_OTHER): Payer: Self-pay | Admitting: Family Medicine

## 2024-05-21 ENCOUNTER — Ambulatory Visit (INDEPENDENT_AMBULATORY_CARE_PROVIDER_SITE_OTHER): Payer: Commercial Managed Care - PPO | Admitting: Family Medicine

## 2024-05-21 VITALS — BP 135/95 | HR 53 | Temp 98.7°F | Resp 18 | Ht 69.0 in | Wt 164.0 lb

## 2024-05-21 DIAGNOSIS — E785 Hyperlipidemia, unspecified: Secondary | ICD-10-CM | POA: Insufficient documentation

## 2024-05-21 DIAGNOSIS — R030 Elevated blood-pressure reading, without diagnosis of hypertension: Secondary | ICD-10-CM | POA: Diagnosis not present

## 2024-05-21 DIAGNOSIS — F32A Depression, unspecified: Secondary | ICD-10-CM

## 2024-05-21 DIAGNOSIS — F419 Anxiety disorder, unspecified: Secondary | ICD-10-CM | POA: Diagnosis not present

## 2024-05-21 DIAGNOSIS — H00016 Hordeolum externum left eye, unspecified eyelid: Secondary | ICD-10-CM | POA: Insufficient documentation

## 2024-05-21 DIAGNOSIS — Z0000 Encounter for general adult medical examination without abnormal findings: Secondary | ICD-10-CM

## 2024-05-21 MED ORDER — TRAZODONE HCL 100 MG PO TABS: ORAL_TABLET | ORAL | 2 refills | Status: AC

## 2024-05-21 NOTE — Progress Notes (Signed)
    Procedures performed today:    None.  Independent interpretation of notes and tests performed by another provider:   None.  Brief History, Exam, Impression, and Recommendations:    BP (!) 135/95   Pulse (!) 53   Temp 98.7 F (37.1 C) (Oral)   Resp 18   Ht 5' 9 (1.753 m)   Wt 164 lb (74.4 kg)   SpO2 98%   BMI 24.22 kg/m   Hyperlipidemia, unspecified hyperlipidemia type Assessment & Plan: Notes family history of high cholesterol.  Also recalls that grandfather had heart disease/possible heart attack in his 35s.  He has been focusing on lifestyle modifications. We discussed general considerations, reviewed management options.  Discussed recommendation to primarily focus on lifestyle modifications.  We discussed risk and benefits related to medication, specifically related to statin therapy.  Ultimately feel that risks would outweigh benefits of statin therapy at the present time.  Discussed importance of blood pressure control as well.  Can continue to monitor cholesterol panel intermittently moving forward.   Anxiety and depression Assessment & Plan: Patient denies any issues with medications today.  Previously did have some difficulty with Cymbalta  at higher dose, continues with lower dose of Cymbalta  as well as trazodone .  Needing refill of trazodone  today.  No other concerns reported Refill medication sent to pharmacy on file.  We can continue to monitor symptoms intermittently moving forward.  Orders: -     traZODone  HCl; TAKE 0.5-1 TABLET (50-100 MG TOTAL) BY MOUTH AT BEDTIME.  Dispense: 90 tablet; Refill: 2  Wellness examination -     CBC with Differential/Platelet; Future -     Comprehensive metabolic panel with GFR; Future -     Lipid panel; Future -     TSH Rfx on Abnormal to Free T4; Future -     Hemoglobin A1c; Future  Hordeolum externum of left eye, unspecified eyelid Assessment & Plan: About 2 weeks ago, noticed painful swollen area along left lower  eyelid.  He did have some discharge from this about 2 days later.  Generally is doing well, however still have some mild redness of left lower eyelid.  No significant pain, denies any visual changes. On exam, there is mild redness along left lower eyelid, more so towards lateral portion.  Very mild swelling, no mass noted.  Normal extraocular movements. Suspect resolving hordeolum and given timing, would give it little bit longer to fully resolve.  Can utilize warm compress to help with resolution.  Discussed that if this does persist over the coming weeks, to let us  know and we can proceed with referral to ophthalmologist.   Elevated blood pressure reading in office without diagnosis of hypertension Assessment & Plan: Has had borderline elevated blood pressure readings in the office, did discuss slight elevation in office today.  Has not been checking blood pressure regularly at home.  Does admit to having fair amount of salt in his diet.  No current issues with chest pain or headaches. We discussed general considerations, recommend intermittent monitoring of blood pressure at home.  Discussed lifestyle modifications, specifically reviewed DASH diet.  Handout provided as well. We can plan to monitor intermittently moving forward.  Consideration for potential pharmacotherapy pending progress   Return in about 6 months (around 11/21/2024) for CPE with fasting labs 1 week prior.   ___________________________________________ Bryanda Mikel de [place], MD, ABFM, CAQSM [facility]

## 2024-05-21 NOTE — Assessment & Plan Note (Signed)
 Has had borderline elevated blood pressure readings in the office, did discuss slight elevation in office today.  Has not been checking blood pressure regularly at home.  Does admit to having fair amount of salt in his diet.  No current issues with chest pain or headaches. We discussed general considerations, recommend intermittent monitoring of blood pressure at home.  Discussed lifestyle modifications, specifically reviewed DASH diet.  Handout provided as well. We can plan to monitor intermittently moving forward.  Consideration for potential pharmacotherapy pending progress

## 2024-05-21 NOTE — Assessment & Plan Note (Signed)
 About 2 weeks ago, noticed painful swollen area along left lower eyelid.  He did have some discharge from this about 2 days later.  Generally is doing well, however still have some mild redness of left lower eyelid.  No significant pain, denies any visual changes. On exam, there is mild redness along left lower eyelid, more so towards lateral portion.  Very mild swelling, no mass noted.  Normal extraocular movements. Suspect resolving hordeolum and given timing, would give it little bit longer to fully resolve.  Can utilize warm compress to help with resolution.  Discussed that if this does persist over the coming weeks, to let us  know and we can proceed with referral to ophthalmologist.

## 2024-05-21 NOTE — Assessment & Plan Note (Signed)
 Patient denies any issues with medications today.  Previously did have some difficulty with Cymbalta  at higher dose, continues with lower dose of Cymbalta  as well as trazodone .  Needing refill of trazodone  today.  No other concerns reported Refill medication sent to pharmacy on file.  We can continue to monitor symptoms intermittently moving forward.

## 2024-05-21 NOTE — Assessment & Plan Note (Signed)
 Notes family history of high cholesterol.  Also recalls that grandfather had heart disease/possible heart attack in his 41s.  He has been focusing on lifestyle modifications. We discussed general considerations, reviewed management options.  Discussed recommendation to primarily focus on lifestyle modifications.  We discussed risk and benefits related to medication, specifically related to statin therapy.  Ultimately feel that risks would outweigh benefits of statin therapy at the present time.  Discussed importance of blood pressure control as well.  Can continue to monitor cholesterol panel intermittently moving forward.

## 2024-07-17 ENCOUNTER — Other Ambulatory Visit (HOSPITAL_BASED_OUTPATIENT_CLINIC_OR_DEPARTMENT_OTHER): Payer: Self-pay | Admitting: Family Medicine

## 2024-07-17 DIAGNOSIS — F419 Anxiety disorder, unspecified: Secondary | ICD-10-CM

## 2024-11-21 ENCOUNTER — Encounter (HOSPITAL_BASED_OUTPATIENT_CLINIC_OR_DEPARTMENT_OTHER): Admitting: Family Medicine
